# Patient Record
Sex: Male | Born: 1990 | Race: White | Hispanic: No | Marital: Single | Smoking: Current every day smoker
Health system: Southern US, Community
[De-identification: ages and names within clinical notes are randomized; demographics above are authoritative.]

## PROBLEM LIST (undated history)

## (undated) DIAGNOSIS — F329 Major depressive disorder, single episode, unspecified: Secondary | ICD-10-CM

## (undated) DIAGNOSIS — G709 Myoneural disorder, unspecified: Secondary | ICD-10-CM

## (undated) DIAGNOSIS — I10 Essential (primary) hypertension: Secondary | ICD-10-CM

## (undated) DIAGNOSIS — K219 Gastro-esophageal reflux disease without esophagitis: Secondary | ICD-10-CM

## (undated) HISTORY — PX: OTHER SURGICAL HISTORY: SHX169

## (undated) HISTORY — PX: BLADDER SURGERY: SHX569

## (undated) HISTORY — DX: Essential (primary) hypertension: I10

## (undated) HISTORY — PX: APPENDECTOMY: SHX54

## (undated) HISTORY — DX: Myoneural disorder, unspecified: G70.9

---
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
## 2009-09-10 ENCOUNTER — Ambulatory Visit: Payer: Self-pay | Admitting: Family Medicine

## 2009-09-10 ENCOUNTER — Encounter: Payer: Self-pay | Admitting: Family Medicine

## 2009-09-10 DIAGNOSIS — I10 Essential (primary) hypertension: Secondary | ICD-10-CM | POA: Insufficient documentation

## 2009-09-10 LAB — CONVERTED CEMR LAB
Ketones, urine, test strip: NEGATIVE
Nitrite: POSITIVE
Protein, U semiquant: 30
pH: 5.5

## 2009-10-06 ENCOUNTER — Ambulatory Visit: Payer: Self-pay | Admitting: Family Medicine

## 2009-10-28 ENCOUNTER — Encounter
Admission: RE | Admit: 2009-10-28 | Discharge: 2009-11-04 | Payer: Self-pay | Admitting: Physical Medicine & Rehabilitation

## 2009-11-04 ENCOUNTER — Ambulatory Visit: Payer: Self-pay | Admitting: Physical Medicine & Rehabilitation

## 2009-12-02 ENCOUNTER — Encounter: Payer: Self-pay | Admitting: Family Medicine

## 2010-02-09 ENCOUNTER — Encounter: Payer: Self-pay | Admitting: Family Medicine

## 2010-04-01 ENCOUNTER — Encounter: Payer: Self-pay | Admitting: Family Medicine

## 2010-04-06 ENCOUNTER — Encounter: Payer: Self-pay | Admitting: Family Medicine

## 2010-04-06 LAB — CONVERTED CEMR LAB
BUN: 12 mg/dL
CO2: 26 meq/L
Chloride: 105 meq/L
HDL: 44 mg/dL
LDL Cholesterol: 70.3 mg/dL
Potassium: 3.8 meq/L
Sodium: 145 meq/L
Total CHOL/HDL Ratio: 3
Triglycerides: 83 mg/dL
VLDL: 16.6 mg/dL

## 2010-04-15 ENCOUNTER — Encounter: Payer: Self-pay | Admitting: Family Medicine

## 2010-06-02 NOTE — Miscellaneous (Signed)
Summary: home health forms done  Clinical Lists Changes [facility] forms completed and put in to be mailed box.  Sarah Swaziland MD  February 09, 2010 9:33 AM

## 2010-06-02 NOTE — Miscellaneous (Signed)
Summary: Home health certification and orders for labs  Clinical Lists Changes Filled out home health forms.  Noted BP is 128/80.   Requested TSH, BMP and fasting lipid panel.  Bjorn Loser took the verbal order and said they will send written confirmation.  Sarah Swaziland MD  April 01, 2010 11:15 AM
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
## 2010-06-02 NOTE — Assessment & Plan Note (Signed)
Summary: f/u eo   Vital Signs:  Patient profile:   20 year old male Weight:      156.5 pounds Temp:     98.4 degrees F oral Pulse rate:   91 / minute Pulse rhythm:   regular BP sitting:   134 / 83  (left arm) Cuff size:   regular  Vitals Entered By: Loralee Pacas CMA (October 06, 2009 2:39 PM)  Primary Care Provider:  Lequita Asal  MD  CC:  f/u BP and UTI.  History of Present Illness: HTN- BP improved on metoprolol. no chest pain,  SOB, headaches, blurred vision, swelling.   UTI- patient took all but two of antibiotic pills. denies dysuria, malodorous urine, incomplete voiding. no fever/chills/N/V, back pain.  Habits & Providers  Alcohol-Tobacco-Diet     Tobacco Status: current     Tobacco Counseling: to quit use of tobacco products  Current Medications (verified): 1)  Foley Catheters 2)  Metoprolol Tartrate 25 Mg Tabs (Metoprolol Tartrate) .... One Tab By Mouth Bid  Allergies (verified): No Known Drug Allergies  Physical Exam  General:  Well-developed,well-nourished,in no acute distress; alert,appropriate and cooperative throughout examination. vitals reviewed.    Impression & Recommendations:  Problem # 1:  CYSTITIS (ICD-595.9) Assessment Improved  resolved. office notes and culture results to urologist.   Orders: [facility]- Est Level  3 (81191)  Problem # 2:  HYPERTENSION, BENIGN ESSENTIAL (ICD-401.1) Assessment: Improved  no changes.   His updated medication list for this problem includes:    Metoprolol Tartrate 25 Mg Tabs (Metoprolol tartrate) ..... One tab by mouth bid  Orders: [facility]- Est Level  3 (47829)   Prevention & Chronic Care Immunizations   Influenza vaccine: Not documented    Tetanus booster: Not documented    Pneumococcal vaccine: Not documented  Other Screening   Smoking status: current  (10/06/2009)   Smoking cessation counseling: yes  (11/21/2007)  Hypertension   Last Blood Pressure: 134 / 83  (10/06/2009)   Serum creatinine:  Not documented   Serum potassium Not documented    Hypertension flowsheet reviewed?: Yes   Progress toward BP goal: At goal  Self-Management Support :   Personal Goals (by the next clinic visit) :      Personal blood pressure goal: 140/90  (09/10/2009)   Hypertension self-management support: Written self-care plan  (09/10/2009)    Hypertension self-management support not done because: Good outcomes  (10/06/2009)

## 2010-06-02 NOTE — Miscellaneous (Signed)
Summary: HH forms  Clinical Lists Changes Home health forms signed and placed in to be mailed box. Sarah Swaziland MD  December 02, 2009 9:18 AM

## 2010-06-04 NOTE — Miscellaneous (Signed)
Summary: lab report (BMP,TSH, lipid panel-nl) entered  Clinical Lists Changes  Observations: Added new observation of DM PROGRESS: N/A (04/15/2010 9:28) Added new observation of DM FSREVIEW: N/A (04/15/2010 9:28) Added new observation of LIPID PROGRS: N/A (04/15/2010 9:28) Added new observation of LIPID FSREVW: N/A (04/15/2010 9:28) Added new observation of TSH: 2.69 microintl units/mL (04/06/2010 9:28) Added new observation of CHOL/HDL: 3.0  (04/06/2010 9:28) Added new observation of VLDL: 16.6 mg/dL (16/02/9603 5:40) Added new observation of LDL: 70.3 mg/dL (98/03/9146 8:29) Added new observation of HDL: 44 mg/dL (56/21/3086 5:78) Added new observation of TRIGLYC TOT: 83 mg/dL (46/96/2952 8:41) Added new observation of CHOLESTEROL: 131 mg/dL (32/44/0102 7:25) Added new observation of GFR: >60 mL/min (04/06/2010 9:28) Added new observation of CREATININE: 0.97 mg/dL (36/64/4034 7:42) Added new observation of BUN: 12 mg/dL (59/56/3875 6:43) Added new observation of BG RANDOM: 72 mg/dL (32/95/1884 1:66) Added new observation of ANION GAP: 9.5  (04/06/2010 9:28) Added new observation of CO2 PLSM/SER: 26 meq/L (04/06/2010 9:28) Added new observation of CL SERUM: 105 meq/L (04/06/2010 9:28) Added new observation of K SERUM: 3.8 meq/L (04/06/2010 9:28) Added new observation of NA: 145 meq/L (04/06/2010 9:28)      Prevention & Chronic Care Immunizations   Influenza vaccine: Not documented    Tetanus booster: Not documented    Pneumococcal vaccine: Not documented  Other Screening   Smoking status: current  (10/06/2009)   Smoking cessation counseling: yes  (11/21/2007)  Hypertension   Last Blood Pressure: 134 / 83  (10/06/2009)   Serum creatinine: 0.97  (04/06/2010)   Serum potassium 3.8  (04/06/2010)  Self-Management Support :   Personal Goals (by the next clinic visit) :      Personal blood pressure goal: 140/90  (09/10/2009)   Hypertension self-management support:  Written self-care plan  (09/10/2009)    Hypertension self-management support not done because: Good outcomes  (10/06/2009)

## 2010-06-10 ENCOUNTER — Encounter: Payer: Self-pay | Admitting: *Deleted

## 2010-07-03 ENCOUNTER — Encounter: Payer: Self-pay | Admitting: Family Medicine

## 2010-07-03 ENCOUNTER — Ambulatory Visit (INDEPENDENT_AMBULATORY_CARE_PROVIDER_SITE_OTHER): Payer: Medicaid Other | Admitting: Family Medicine

## 2010-07-03 VITALS — BP 114/70 | HR 123 | Temp 101.5°F | Wt 147.0 lb

## 2010-07-03 DIAGNOSIS — J029 Acute pharyngitis, unspecified: Secondary | ICD-10-CM

## 2010-07-03 DIAGNOSIS — J020 Streptococcal pharyngitis: Secondary | ICD-10-CM

## 2010-07-03 MED ORDER — AMOXICILLIN 875 MG PO TABS: 875.0000 mg | ORAL_TABLET | Freq: Two times a day (BID) | ORAL | Status: AC

## 2010-07-03 NOTE — Progress Notes (Signed)
Subjective:     James Richard is a 20 y.o. male who presents for evaluation of sore throat. Associated symptoms include suspected fevers but not measured at home and sore throat. Onset of symptoms was 1 day ago, and have been gradually worsening since that time. He is not drinking much. He has not had a recent close exposure to someone with proven streptococcal pharyngitis. He also states he has tried tylenol at home for the fever.  He has also noticed dysphagia without cough, nausea, vomiting, diarrhea.  He denies any recent travel and new medications. He also denies any sexual activity.   The following portions of the patient's history were reviewed and updated as appropriate: allergies, current medications, past medical history, past social history, past surgical history and problem list.  Review of Systems Pertinent items are noted in HPI.    Objective:    BP 114/70  Pulse 123  Temp(Src) 101.5 F (38.6 C) (Oral)  Wt 147 lb (66.679 kg) General appearance: alert and cooperative Head: Normocephalic, without obvious abnormality, atraumatic Eyes: conjunctivae/corneas clear. PERRL, EOM's intact. Fundi benign. Ears: normal TM's and external ear canals both ears Nose: Nares normal. Septum midline. Mucosa normal. No drainage or sinus tenderness. Throat: lips, mucosa, and tongue normal; teeth and gums normal and tonsillar hypertrophy 2+ with pharyngeal erythema, mild anterior lymphadenopathy soft nontender mobile Lungs: clear to auscultation bilaterally Heart: S1, S2 normal and tachycardia with rate 110s, regular  Laboratory Strep test done. Results:positive.    Assessment:    Acute pharyngitis, likely  Strep throat.    Plan:    Patient placed on antibiotics. Follow up in 1 week.

## 2010-07-03 NOTE — Patient Instructions (Signed)
It was a pleasure to care for you today.  I have started you on an antibiotic for strep throat infection.  Please return in one week for reevaluation.  Return immediately if with worsening fever, chills, inability to tolerate the medication by mouth or any other concerning symptom.

## 2010-07-31 ENCOUNTER — Ambulatory Visit (HOSPITAL_BASED_OUTPATIENT_CLINIC_OR_DEPARTMENT_OTHER): Payer: Medicaid Other | Admitting: Physical Medicine & Rehabilitation

## 2010-07-31 ENCOUNTER — Encounter: Payer: Medicaid Other | Attending: Physical Medicine & Rehabilitation

## 2010-07-31 DIAGNOSIS — S34109A Unspecified injury to unspecified level of lumbar spinal cord, initial encounter: Secondary | ICD-10-CM

## 2010-07-31 DIAGNOSIS — N319 Neuromuscular dysfunction of bladder, unspecified: Secondary | ICD-10-CM

## 2010-07-31 DIAGNOSIS — G9589 Other specified diseases of spinal cord: Secondary | ICD-10-CM | POA: Insufficient documentation

## 2010-07-31 DIAGNOSIS — X58XXXS Exposure to other specified factors, sequela: Secondary | ICD-10-CM | POA: Insufficient documentation

## 2010-07-31 DIAGNOSIS — G8220 Paraplegia, unspecified: Secondary | ICD-10-CM | POA: Insufficient documentation

## 2010-07-31 DIAGNOSIS — IMO0002 Reserved for concepts with insufficient information to code with codable children: Secondary | ICD-10-CM | POA: Insufficient documentation

## 2010-07-31 DIAGNOSIS — K592 Neurogenic bowel, not elsewhere classified: Secondary | ICD-10-CM | POA: Insufficient documentation

## 2010-09-15 NOTE — Assessment & Plan Note (Signed)
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  He had used  Lofstrand crutches to ambulate and bilateral AFOs.  He has had  development of left upper foot lesion per his mother's report, since I  last saw him on May 25, 2007.  He did follow up with Dr. Isabel Caprice and  had a renal ultrasound.   He has had no other medical issues in the interval time.   Pain level is 0.  He can walk 15 minutes at a time with his Lofstrand  crutches.  He can climb steps with his crutches.  He does not drive.   PHYSICAL EXAMINATION:  VITAL SIGNS:  His blood pressure is 141/63, pulse  84, respiration 18, and O2 sat 97% on room air.  GENERAL:  In no acute distress.  Alert and oriented x3,  Affect is  bright.  He ambulates with crutches.  NEUROLOGIC:  Coordination in upper extremity is normal.  His sensation  in the upper extremity is normal.  His strength in the upper extremity  is normal.  Range of motion is normal in the upper extremities.  Lower  extremity has a callus that has some breakdown near the center in the  left anterolateral foot, just below the joint line, just anterior to the  lateral malleolus.  His pulses are good.  He does have decreased  sensation in the feet, although he is able to distinguish with light  touch.  He has clonus at the left ankle.  Right ankle has 0 reflex.   IMPRESSION:  1. 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  2. Left ankle-foot orthosis, appears to be causing some rubbing at the      lateral malleolar trim line.  Will ask [employer] to reevaluate.  If      correction does not result in healing of wound, we will need to      send him to [facility].  No evidence of infection at this point.   I will see him back in 1 month for followup.      Erick Colace, M.D.  Electronically Signed     AEK/MedQ  D:   11/13/2007 16:42:21  T:  11/14/2007 09:00:15  Job #:  [id number]
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
## 2010-09-15 NOTE — Op Note (Signed)
NAME:  James Richard, James Richard              ACCOUNT NO.:  [id number]   MEDICAL RECORD NO.:  [id number]          PATIENT TYPE:  INP   LOCATION:  2550                         FACILITY:  [facility]   PHYSICIAN:  Adolph Pollack, M.D.DATE OF BIRTH:  1990-06-05   DATE OF PROCEDURE:  09/07/2006  DATE OF DISCHARGE:                               OPERATIVE REPORT   PREOP DIAGNOSIS:  Acute appendicitis.   POSTOPERATIVE DIAGNOSIS:  Acute suppurative appendicitis.   PROCEDURE:  Laparoscopic appendectomy.   SURGEON:  Adolph Pollack, M.D.   ANESTHESIA:  General.   INDICATIONS:  This 20 year old male presented to the emergency  department with worsening abdominal pain and fever.  The pain radiated  to the right lower quadrant and he had leukocytosis.  A CT scan was  consistent with acute appendicitis with possible rupture; and he is now  brought to the operating room.   TECHNIQUE:  He is brought to the operating room and placed supine on the  operating table where general anesthetic was administered.  A Foley  catheter was placed in the bladder.  The abdominal wall was sterilely  prepped and draped.  Marcaine solution was infiltrated in the  supraumbilical region.  He had a spinal injury and had some scoliosis,  so he had a little bit of a shortened abdominal wall with respect to the  craniocaudal aspect.   I made a supraumbilical incision through the skin, subcutaneous tissue,  fascia, and peritoneum; entering the peritoneal cavity under direct  vision.  A pursestring suture of #0 Vicryl was placed around the fascial  edges.  A Hassan trocar was introduced into the peritoneal cavity and a  pneumoperitoneum was created by insufflation of CO2 gas.   Next a laparoscope was introduced.  I noticed that some of the fluid was  slightly cloudy yellow, but not frank pus.  A 5-mm trocar was placed in  the left lower quadrant, and one in the right upper quadrant.  I was  able to expose the appendix; and  it was densely adherent to the pelvic  sidewall.  Using careful hydrodissection and blunt dissection, I freed  it from this attachment.  The appendix was acutely inflamed with some  gangrenous changes and suppuration, but no perforation.  I retracted the  appendix anteriorly and divided the mesoappendix down toward the base of  the cecum with the harmonic scalpel.  I subsequently was then able to  amputate the appendix off the cecum using the Endo-GIA stapler.  I  placed the appendix in the Endopouch bag and removed it through the  supraumbilical port; and then replaced it with a supraumbilical trocar.   I inspected the staple line at the cecal area; and it was hemostatic  with no evidence of leak.  I subsequently copiously irrigated out the  abdominal cavity with 3 liters of saline solution and evacuated this.  The fluid was clear.  I, once again, inspected the staple line; and  there was no bleeding or leak noted.   I subsequently removed the left lower quadrant trocar and the  supraumbilical trocar; and I closed  the supraumbilical fascial defect  under laparoscopic vision by tightening up and tying down the  pursestring suture.  The remaining trocar was removed and the  pneumoperitoneum was released.   The skin incisions were closed with 4-0 Monocryl subcuticular stitches  followed by Steri-Strips and sterile dressings.  He tolerated the  procedure well without apparent complications; and was taken to the  recovery room in satisfactory condition.      Adolph Pollack, M.D.  Electronically Signed     TJR/MEDQ  D:  09/07/2006  T:  09/08/2006  Job:  [id number]

## 2010-09-15 NOTE — Assessment & Plan Note (Signed)
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  He has used Lofstrand crutches to  ambulate with bilateral AFOs.  He had a left dorsum of the foot lesion.  When I last saw him, he had his AFO checked by orthopedist who felt it  was not due to abnormal fit.   He is followed up with Dr. Isabel Caprice from Urology.  He remains on  intermittent catheterization program.  He has zero pain.  He has not  gone to high school.  He really did not finish.  He thought he was going  to move and did not enroll.  He needs assist with meal prep, household  duties, and shopping.  He has trouble walking and spasms.   His feet have intact sensation.  He has no active movement in the ankle  dorsiflexors or plantar flexors bilaterally.  He has 4- knee extensor on  the right, 4+ on the left.  His feet have no evidence of breakdown.  He  has good pulses bilaterally.  He has a callus on the dorsum of his foot  on the left side only.  He has clonus bilateral ankles.   Blood pressure 157/79, pulse 100, respirations 18, O2 sat 98% on room  air.  He just did ambulate in with Lofstrand crutches.   His last BP was 141/63.   IMPRESSION:  1. Conus medullaris syndrome with incomplete paraplegia, has good      upper extremity strength.  We went over weightlifting; however, she      limit overhead weightlifting given that he will be prone to overuse      injury of the shoulders due to Lofstrand  crutch ambulation.  2. Encouraged him to build his quads.  I think, he can benefit from      some strengthening using an ankle weight with knee extensions.  3. I will see him back in 6 months.  We will discuss further in terms      of return to school that is in his plans or doing some kind of      vocational with trade school.   He will follow up with Urology in the meantime.      Erick Colace, M.D.  Electronically Signed     AEK/MedQ  D:  05/16/2008 14:07:47   T:  05/17/2008 03:49:01  Job #:  [id number]   cc:   Valetta Fuller, M.D.  Fax: [phone]
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
## 2010-09-15 NOTE — Assessment & Plan Note (Signed)
An 20 year old male with paraplegia due to a conus medullaris syndrome  dating back to his spinal cord injury incomplete age 22.  He ambulates  with loss trend crutches bilateral AFOs.  He continues to follow up with  Dr. Isabel Caprice from Urology intermittent catheterization program.  He is on  Detrol for bladder spasms.   He needs assist with meal prep, household duties, but otherwise  independent.  He walks with loss trend crutches 20 minutes at a time.  He has no pain.  His sleep is good.  Remainder review of systems is  negative.   Social, single, lives with his grandmother.  No drug or alcohol abuse.   Blood pressure 147/89, pulse 108, respirations 18, O2 sat 96% on room  air.  Well-developed, well-nourished male with short stature and  orientation x3.  Affect alert.  Gait is with loss trend crutches.  He  tends to drag his feet, it has been valgus at the knees.   He has 0 ankle dorsiflexor flaccid in the ankle joint area.  He has 4-  extensor strength right, quad 4+ left.  Hip flexors are 4+ bilateral.  He has a callus over the right navicular as well as the left cuboid.   IMPRESSION:  1. Conus medullaris syndrome incomplete paraplegia.  His AFOs are      fitting him well with some callus, but no other skin breakdown.  We      will go ahead and encourage him to build up his quad strength.  2. Followup with Urology, Dr. Isabel Caprice.  3. I will see him back in the year, sooner if he needs.      Erick Colace, M.D.  Electronically Signed     AEK/MedQ  D:  11/07/2008 15:10:44  T:  11/08/2008 02:09:42  Job #:  [id number]   cc:   Valetta Fuller, M.D.  Fax: [phone]

## 2010-10-15 ENCOUNTER — Ambulatory Visit: Payer: Medicaid Other | Admitting: Family Medicine

## 2011-05-17 ENCOUNTER — Ambulatory Visit (INDEPENDENT_AMBULATORY_CARE_PROVIDER_SITE_OTHER): Payer: Medicaid Other | Admitting: Family Medicine

## 2011-05-17 ENCOUNTER — Encounter: Payer: Self-pay | Admitting: Family Medicine

## 2011-05-17 VITALS — BP 136/78 | HR 96 | Ht 66.0 in | Wt 140.0 lb

## 2011-05-17 DIAGNOSIS — R030 Elevated blood-pressure reading, without diagnosis of hypertension: Secondary | ICD-10-CM

## 2011-05-17 DIAGNOSIS — Z23 Encounter for immunization: Secondary | ICD-10-CM

## 2011-05-17 DIAGNOSIS — Z299 Encounter for prophylactic measures, unspecified: Secondary | ICD-10-CM

## 2011-05-17 DIAGNOSIS — N319 Neuromuscular dysfunction of bladder, unspecified: Secondary | ICD-10-CM

## 2011-05-17 DIAGNOSIS — Z0000 Encounter for general adult medical examination without abnormal findings: Secondary | ICD-10-CM

## 2011-05-17 DIAGNOSIS — F17200 Nicotine dependence, unspecified, uncomplicated: Secondary | ICD-10-CM

## 2011-05-17 NOTE — Patient Instructions (Signed)
That's great that you are considering quitting smoking again.  If you want to meet with Dr. Raymondo Band, we will be happy to set that up.  You might also want to think aobut the Quitline 1 [phone] QUIT NOW.  Your blood pressure is fine today. Good luck on the test tonight. We will give you the flu shot and tetanus/whooping cough shot today. Please call and make an appointment with the lab for a cholesterol test when it suits you.

## 2011-05-18 ENCOUNTER — Encounter: Payer: Self-pay | Admitting: Family Medicine

## 2011-05-18 DIAGNOSIS — Z299 Encounter for prophylactic measures, unspecified: Secondary | ICD-10-CM | POA: Insufficient documentation

## 2011-05-18 DIAGNOSIS — R030 Elevated blood-pressure reading, without diagnosis of hypertension: Secondary | ICD-10-CM | POA: Insufficient documentation

## 2011-05-18 DIAGNOSIS — Z23 Encounter for immunization: Secondary | ICD-10-CM

## 2011-05-18 NOTE — Assessment & Plan Note (Signed)
Doing well without problems.  Has adequate supplies.  Continue current management.

## 2011-05-18 NOTE — Assessment & Plan Note (Addendum)
Due for lipid screening.  RF include tobacco use.   No high risk behaviors noted.  + Exercise.  No high risk sexual activity.  Uses seat belt. Flu and Tdap immunizations today.

## 2011-05-18 NOTE — Assessment & Plan Note (Addendum)
Counseled about cessation and offered resources.  Pt would like to quit on his own.
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
## 2011-07-27 ENCOUNTER — Ambulatory Visit: Payer: Medicaid Other | Admitting: Physical Medicine & Rehabilitation

## 2011-08-16 ENCOUNTER — Ambulatory Visit: Payer: Medicaid Other | Admitting: Physical Medicine & Rehabilitation

## 2011-08-27 ENCOUNTER — Encounter: Payer: Medicaid Other | Attending: Physical Medicine & Rehabilitation

## 2011-08-27 ENCOUNTER — Encounter: Payer: Self-pay | Admitting: Physical Medicine & Rehabilitation

## 2011-08-27 ENCOUNTER — Ambulatory Visit (HOSPITAL_BASED_OUTPATIENT_CLINIC_OR_DEPARTMENT_OTHER): Payer: Medicaid Other | Admitting: Physical Medicine & Rehabilitation

## 2011-08-27 VITALS — BP 139/74 | HR 93 | Ht 67.0 in | Wt 160.0 lb

## 2011-08-27 DIAGNOSIS — N319 Neuromuscular dysfunction of bladder, unspecified: Secondary | ICD-10-CM | POA: Insufficient documentation

## 2011-08-27 DIAGNOSIS — Q059 Spina bifida, unspecified: Secondary | ICD-10-CM | POA: Insufficient documentation

## 2011-08-27 DIAGNOSIS — Q057 Lumbar spina bifida without hydrocephalus: Secondary | ICD-10-CM

## 2011-08-27 DIAGNOSIS — G8220 Paraplegia, unspecified: Secondary | ICD-10-CM | POA: Insufficient documentation

## 2011-08-27 NOTE — Patient Instructions (Addendum)
Myelomeningocele Myelomeningocele happens when a fetus is in the womb. It is a type of spina bifida. It is a problem where the spinal column and vertebrae are not formed right in the womb. When the spinal cord is forming, it normally starts out shaped like an open tube. This tube slowly closes from top to bottom. When this tube does not close all the way, problems like myelomeningocele happen. It causes a sac containing spinal cord nerves, the covering of the spinal cord, and spinal fluid to stick out of the back at birth.  CAUSES  The exact cause of myelomeningocele is not known. However, having one child with this problem increases the risk in future pregnancies.  SYMPTOMS  A fluid-filled sac sticks out of the back at birth. Other symptoms depend on where in the back the problem exists. Symptoms can include:  Loss of bladder or bowel control. This can cause urine or bowel leaking. Sometimes there can be constipation or inability to pass stool.   Lack of feeling in body parts below the problem.   Partially or completely unable to move the legs (paralysis). This can cause difficulties or the inability to crawl or walk.   Joint problems. This is mostly noticed in the hips, knees, and feet. Joint issues can cause deformities or problems with normal function of the joints.   Fluid and pressure buildup in the skull due to poor drainage of the spinal fluid.  Most children with this problem have normal intelligence. They can live a normal life span with the right care.  DIAGNOSIS  Myelomeningocele can be diagnosed before birth. Screening blood tests and an ultrasound of the fetus are usually performed to confirm a diagnosis.  TREATMENT  There is no cure for myelomeningocele. There are many treatments available for associated problems. Usually, a team of caregivers are needed to address all the issues.  Surgery is often performed first. Surgery will close the opening in the back to stop infection.  Often, another surgery is done to place a tube called a VP shunt (ventriculoperitoneal shunt) from the brain to the abdomen. This tube prevents the buildup of fluid and pressure in the skull (hydrocephalus) due to the lack of normal drainage of the spinal fluid.   Corrective surgery and bracing is often needed for leg problems. A curved spine may need treatment. The muscles, joints, bones, urinary tract system, nervous system, and intestines may be evaluated to determine the specific treatment. Orthopedic (muscles, joint, and bones) problems with the knees, feet, hips, or back may need treatment right away and careful following over time.   Medications, surgery, and putting a tube in the bladder to help empty urine (bladder catheterization) can help with bladder problems.Kidney and bladder function need regular testing.   Medicines, diet, and putting fluid into the rectum (enema) can help with constipation and stool leakage.   Medications are used to treat muscle jerking or shaking (convulsions).  HOME CARE INSTRUCTIONS   Loss of feeling in the buttocks and legs can lead to injury. To prevent this, careful attention to bath water temperature and other heat sources is important. Careful skin care and watching for redness or sores should be done.   The parents are an important part of the team of caregivers. Treatments for bladder, orthopedic, and bowel problems will need to continue at home. These will be directed by the caregiver team.   Families are often stressed by their child being diagnosed with this condition and all the ongoing treatments  and surgeries. Support groups and counseling are useful in helping families work through the issues. Look to friends and families for support if needed.  SEEK MEDICAL CARE IF:   Your child shows signs of increased fluid and pressure buildup due to blockage of the VP shunt.   Headaches.   Personality changes.   Poor school performance.   Throwing up  (vomiting).   Your child shows signs of problems around the spinal cord surgery area.   Change in walking.   Loss of strength.   Unusual stiffness in legs.   Change in bowel or bladder function.   Pain in the surgery area.   Worsening tiredness (fatigue) in the legs.   Increase in any curvature of the spine   You suspect your child is having convulsions, look for:   Rhythmic jerking or twitching of the arms or legs.   Sudden falls for no reason.   Lack of response or dazed behavior for brief periods.   Staring or rapid blinking.   Unusual sleepiness or irritability when waking.  SEEK IMMEDIATE MEDICAL CARE IF:   Your child shows signs of increased fluid and pressure buildup due to blockage of the VP shunt.   Bulging soft spot.   Increasing head size.   Irritability.   Poor feeding.   Feeling sleepy all the time.   Throwing up (vomiting).   Your child shows signs of increased pressure buildup at the base of the brain.   Squeaky, labored inhaling.   Choking with food.   Hoarseness or lack of a voice.   Shallow breathing or periods where breathing stops.   There are signs of infection of the shunt, which would be any of the signs of increased pressure plus a fever.   A convulsion lasts more than 5 minutes, is unusual in some way, or breathing trouble follows.  MAKE SURE YOU:   Understand these instructions.   Will watch your condition.   Will get help right away if you are not doing well or get worse.  Document Released: 05/09/2007 Document Revised: 04/08/2011 Document Reviewed: 05/09/2007 [facility] Patient Information 2012 [place], [place].

## 2011-08-27 NOTE — Progress Notes (Signed)
  Subjective:    Patient ID: James Richard, male    DOB: 01/12/1991, 21 y.o.   MRN: [id number]  HPI  no new medical issues since last visit approximately one year ago. Continues to see a urologist for his chronic neurogenic bladder.  Finishing her GED and plans to go to college. Now has a driver's license and is driving Pain Inventory Average Pain 0 Pain Right Now 0 My pain is n/a  In the last 24 hours, has pain interfered with the following? General activity 0 Relation with others 0 Enjoyment of life 0 What TIME of day is your pain at its worst? n/a Sleep (in general) Good  Pain is worse with: walking Pain improves with: rest Relief from Meds: 0  Mobility walk with assistance how many minutes can you walk? 10 min ability to climb steps?  yes do you drive?  yes  Function not employed: date last employed   Neuro/Psych bladder control problems trouble walking  Prior Studies Any changes since last visit?  no  Physicians involved in your care Any changes since last visit?  no       Review of Systems  Genitourinary: Positive for difficulty urinating.  All other systems reviewed and are negative.       Objective:   Physical Exam  Constitutional: He is oriented to person, place, and time. He appears well-developed and well-nourished.  Musculoskeletal:       Right shoulder: Normal.       Left shoulder: Normal.  Neurological: He is alert and oriented to person, place, and time. No sensory deficit.        Bilateral foot drop using AFOs   hip flexor knee extensor strength is 4/5  Psychiatric: He has a normal mood and affect.          Assessment & Plan:   1. 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 independent

## 2011-08-29 DIAGNOSIS — Q057 Lumbar spina bifida without hydrocephalus: Secondary | ICD-10-CM | POA: Insufficient documentation

## 2011-11-08 ENCOUNTER — Telehealth: Payer: Self-pay | Admitting: Physical Medicine & Rehabilitation

## 2011-11-08 NOTE — Telephone Encounter (Signed)
Cuff of crutch is broken.  How does she go about getting new crutches?

## 2011-11-09 ENCOUNTER — Telehealth: Payer: Self-pay | Admitting: Family Medicine

## 2011-11-09 NOTE — Telephone Encounter (Signed)
LMOM informing of below.  

## 2011-11-09 NOTE — Telephone Encounter (Signed)
Will hand write a script for the braces and crutches.  Unable to figure out how to order through [employer].

## 2011-11-09 NOTE — Telephone Encounter (Signed)
Lm advising pt to contact PT, [facility] or [facility].  Numbers given.

## 2011-11-09 NOTE — Telephone Encounter (Signed)
Grandmother is calling because since he has Medicaid, she doesn't know where she can go to get the Rx for Arm Crutches filled and would like Dr. [place] to assist with this.

## 2011-11-09 NOTE — Telephone Encounter (Signed)
Rx left at front desk.  Could you let family know?

## 2011-11-09 NOTE — Telephone Encounter (Signed)
His braces for his arm crutches has broken and needs a new script to replace these.  pls call when ready - needs these asap since he can't get around without them.

## 2011-11-16 NOTE — Telephone Encounter (Signed)
It looks like the [facility] gave him some phone numbers of people to call.  I would try those, but if he still needs help we can try to figure something out.

## 2011-11-16 NOTE — Telephone Encounter (Signed)
I know he uses one when it is raining and needs to go out an about, but I don't see why he would need a new one.  He had a wheelchair at his last visit with me, so I am unclear why someone would make him get a new one.

## 2011-11-16 NOTE — Telephone Encounter (Signed)
Spoke with grandmother and she stated that when she went to go get crutches that she was told James Richard needed a wheelchair also. He has not used a wheelchair in years she said. Will patient need to come in first before this can be done? i do not believe you have seen him before

## 2012-08-07 ENCOUNTER — Encounter: Payer: Self-pay | Admitting: Family Medicine

## 2012-08-07 ENCOUNTER — Ambulatory Visit (INDEPENDENT_AMBULATORY_CARE_PROVIDER_SITE_OTHER): Payer: Medicaid Other | Admitting: Family Medicine

## 2012-08-07 VITALS — BP 127/76 | HR 95 | Temp 99.3°F | Wt 164.0 lb

## 2012-08-07 DIAGNOSIS — Z299 Encounter for prophylactic measures, unspecified: Secondary | ICD-10-CM

## 2012-08-07 DIAGNOSIS — Z0000 Encounter for general adult medical examination without abnormal findings: Secondary | ICD-10-CM

## 2012-08-07 DIAGNOSIS — R030 Elevated blood-pressure reading, without diagnosis of hypertension: Secondary | ICD-10-CM

## 2012-08-07 DIAGNOSIS — F17200 Nicotine dependence, unspecified, uncomplicated: Secondary | ICD-10-CM

## 2012-08-07 DIAGNOSIS — N319 Neuromuscular dysfunction of bladder, unspecified: Secondary | ICD-10-CM

## 2012-08-07 LAB — COMPREHENSIVE METABOLIC PANEL
ALT: 19 U/L (ref 0–53)
AST: 25 U/L (ref 0–37)
BUN: 14 mg/dL (ref 6–23)
Creat: 1.19 mg/dL (ref 0.50–1.35)
Total Bilirubin: 0.4 mg/dL (ref 0.3–1.2)

## 2012-08-07 LAB — CBC
HCT: 43.4 % (ref 39.0–52.0)
MCH: 29 pg (ref 26.0–34.0)
MCV: 87.5 fL (ref 78.0–100.0)
Platelets: 201 10*3/uL (ref 150–400)
RDW: 13.1 % (ref 11.5–15.5)
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
## 2012-08-07 NOTE — Assessment & Plan Note (Signed)
Not ready to quit. Plan: Counseling and discussed resources.

## 2012-08-07 NOTE — Patient Instructions (Addendum)
It has been a pleasure to see you today. I will call you with the labs results if they come back abnormal. Make your next appointment in 1 year or sooner if needed.

## 2012-08-07 NOTE — Assessment & Plan Note (Signed)
Normal BP today!

## 2012-08-07 NOTE — Assessment & Plan Note (Signed)
Lipid profile not indicated on pt with low risk factors and younger of 22 y/o. Smoking Cessation counseled. Pt not ready to quit yet.

## 2012-08-07 NOTE — Assessment & Plan Note (Signed)
Self cath QID on Vesicare Plan: Continue current regimen.

## 2012-08-21 ENCOUNTER — Encounter: Payer: Self-pay | Admitting: Physical Medicine & Rehabilitation

## 2012-08-21 ENCOUNTER — Encounter: Payer: Medicaid Other | Attending: Physical Medicine & Rehabilitation

## 2012-08-21 ENCOUNTER — Ambulatory Visit (HOSPITAL_BASED_OUTPATIENT_CLINIC_OR_DEPARTMENT_OTHER): Payer: Medicaid Other | Admitting: Physical Medicine & Rehabilitation

## 2012-08-21 VITALS — BP 147/71 | HR 79 | Resp 16 | Ht 66.0 in | Wt 165.0 lb

## 2012-08-21 DIAGNOSIS — G8220 Paraplegia, unspecified: Secondary | ICD-10-CM | POA: Insufficient documentation

## 2012-08-21 DIAGNOSIS — F17200 Nicotine dependence, unspecified, uncomplicated: Secondary | ICD-10-CM | POA: Insufficient documentation

## 2012-08-21 DIAGNOSIS — I10 Essential (primary) hypertension: Secondary | ICD-10-CM | POA: Insufficient documentation

## 2012-08-21 DIAGNOSIS — N319 Neuromuscular dysfunction of bladder, unspecified: Secondary | ICD-10-CM | POA: Insufficient documentation

## 2012-08-21 DIAGNOSIS — Q057 Lumbar spina bifida without hydrocephalus: Secondary | ICD-10-CM

## 2012-08-21 DIAGNOSIS — Q059 Spina bifida, unspecified: Secondary | ICD-10-CM | POA: Insufficient documentation
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
## 2013-03-09 ENCOUNTER — Ambulatory Visit: Payer: Medicaid Other | Admitting: Family Medicine

## 2013-05-09 ENCOUNTER — Telehealth: Payer: Self-pay | Admitting: Family Medicine

## 2013-05-09 NOTE — Telephone Encounter (Signed)
[facility] Nurse Vickie called. James RadonBradley would like a new wheelchair and crutches. Please advise as to how he should get these

## 2013-05-12 NOTE — Telephone Encounter (Signed)
Pt needs office visit to proper assess and evaluate the indication for this request.

## 2013-05-14 NOTE — Telephone Encounter (Signed)
Related message,patients caregiver voiced understanding. James Richard, James Richard

## 2013-05-23 ENCOUNTER — Ambulatory Visit: Payer: Medicaid Other | Admitting: Family Medicine

## 2013-06-18 ENCOUNTER — Ambulatory Visit: Payer: Medicaid Other | Admitting: Family Medicine

## 2013-06-26 ENCOUNTER — Telehealth: Payer: Self-pay | Admitting: Family Medicine

## 2013-06-26 NOTE — Telephone Encounter (Signed)
[name] need a note written for James Richard stating that he is unable to attend jury duty due to his medical condition.  Also need order to obtain new wheelchair and crutches.  Please call [name] back asap.

## 2013-07-02 ENCOUNTER — Encounter: Payer: Self-pay | Admitting: Family Medicine

## 2013-07-02 NOTE — Telephone Encounter (Signed)
Mom called again about jury duty note. James MccallumJury duty is scheduled for 08-16-13 Please let mom know when the note is ready for pickup

## 2013-07-02 NOTE — Telephone Encounter (Signed)
I have printed the letter. Pt can come to pick it up or if whishes we can mail it to him. Please let him know. Thanks.

## 2013-07-02 NOTE — Telephone Encounter (Signed)
Please advise.Thank You .James Richard S  

## 2013-07-02 NOTE — Telephone Encounter (Signed)
Left message to return my call.James Richard S  

## 2013-09-14 ENCOUNTER — Emergency Department (HOSPITAL_COMMUNITY)
Admission: EM | Admit: 2013-09-14 | Discharge: 2013-09-16 | Disposition: A | Discharge status: Home / Self Care | Payer: Medicaid Other | Attending: Emergency Medicine | Admitting: Emergency Medicine

## 2013-09-14 ENCOUNTER — Encounter (HOSPITAL_COMMUNITY): Payer: Self-pay | Admitting: Emergency Medicine

## 2013-09-14 DIAGNOSIS — R45851 Suicidal ideations: Secondary | ICD-10-CM

## 2013-09-14 DIAGNOSIS — F1210 Cannabis abuse, uncomplicated: Secondary | ICD-10-CM | POA: Insufficient documentation

## 2013-09-14 DIAGNOSIS — I10 Essential (primary) hypertension: Secondary | ICD-10-CM | POA: Insufficient documentation

## 2013-09-14 DIAGNOSIS — R45850 Homicidal ideations: Secondary | ICD-10-CM | POA: Insufficient documentation

## 2013-09-14 DIAGNOSIS — Z87828 Personal history of other (healed) physical injury and trauma: Secondary | ICD-10-CM | POA: Insufficient documentation

## 2013-09-14 DIAGNOSIS — F17200 Nicotine dependence, unspecified, uncomplicated: Secondary | ICD-10-CM | POA: Insufficient documentation

## 2013-09-14 DIAGNOSIS — Z79899 Other long term (current) drug therapy: Secondary | ICD-10-CM | POA: Insufficient documentation

## 2013-09-14 DIAGNOSIS — F329 Major depressive disorder, single episode, unspecified: Secondary | ICD-10-CM | POA: Diagnosis present

## 2013-09-14 DIAGNOSIS — G8220 Paraplegia, unspecified: Secondary | ICD-10-CM | POA: Insufficient documentation

## 2013-09-14 DIAGNOSIS — S0510XA Contusion of eyeball and orbital tissues, unspecified eye, initial encounter: Secondary | ICD-10-CM | POA: Insufficient documentation

## 2013-09-14 HISTORY — DX: Major depressive disorder, single episode, unspecified: F32.9

## 2013-09-14 LAB — RAPID URINE DRUG SCREEN, HOSP PERFORMED
AMPHETAMINES: NOT DETECTED
BENZODIAZEPINES: NOT DETECTED
Barbiturates: NOT DETECTED
Cocaine: NOT DETECTED
OPIATES: NOT DETECTED
Tetrahydrocannabinol: POSITIVE — AB

## 2013-09-14 LAB — CBC
HCT: 43.3 % (ref 39.0–52.0)
Hemoglobin: 15 g/dL (ref 13.0–17.0)
MCH: 30.1 pg (ref 26.0–34.0)
MCHC: 34.6 g/dL (ref 30.0–36.0)
MCV: 86.9 fL (ref 78.0–100.0)
PLATELETS: 195 10*3/uL (ref 150–400)
RBC: 4.98 MIL/uL (ref 4.22–5.81)
RDW: 12.7 % (ref 11.5–15.5)
WBC: 17 10*3/uL — AB (ref 4.0–10.5)

## 2013-09-14 LAB — COMPREHENSIVE METABOLIC PANEL
ALBUMIN: 4.5 g/dL (ref 3.5–5.2)
ALT: 24 U/L (ref 0–53)
AST: 34 U/L (ref 0–37)
Alkaline Phosphatase: 86 U/L (ref 39–117)
BILIRUBIN TOTAL: 0.5 mg/dL (ref 0.3–1.2)
BUN: 14 mg/dL (ref 6–23)
CALCIUM: 9.5 mg/dL (ref 8.4–10.5)
CHLORIDE: 103 meq/L (ref 96–112)
CO2: 21 mEq/L (ref 19–32)
CREATININE: 1.09 mg/dL (ref 0.50–1.35)
GFR calc Af Amer: >90 mL/min (ref >90)
GFR calc non Af Amer: >90 mL/min (ref >90)
Glucose, Bld: 88 mg/dL (ref 70–99)
Potassium: 4.2 mEq/L (ref 3.7–5.3)
Sodium: 140 mEq/L (ref 137–147)
Total Protein: 7.1 g/dL (ref 6.0–8.3)

## 2013-09-14 LAB — SALICYLATE LEVEL

## 2013-09-14 LAB — ETHANOL: Alcohol, Ethyl (B): <11 mg/dL (ref 0–11)

## 2013-09-14 LAB — ACETAMINOPHEN LEVEL: Acetaminophen (Tylenol), Serum: <15 ug/mL (ref 10–30)

## 2013-09-14 MED ORDER — NICOTINE 21 MG/24HR TD PT24
21.0000 mg | MEDICATED_PATCH | Freq: Every day | TRANSDERMAL | Status: DC
  Administered 2013-09-14 – 2013-09-16 (×2): 21 mg via TRANSDERMAL
  Filled 2013-09-14 (×2): qty 1

## 2013-09-14 MED ORDER — LORAZEPAM 1 MG PO TABS: 1.0000 mg | ORAL_TABLET | Freq: Three times a day (TID) | ORAL | Status: DC | PRN

## 2013-09-14 MED ORDER — ACETAMINOPHEN 325 MG PO TABS
650.0000 mg | ORAL_TABLET | ORAL | Status: DC | PRN
  Administered 2013-09-14: 650 mg via ORAL
  Filled 2013-09-14: qty 2

## 2013-09-14 MED ORDER — DARIFENACIN HYDROBROMIDE ER 7.5 MG PO TB24
7.5000 mg | ORAL_TABLET | Freq: Every day | ORAL | Status: DC
  Administered 2013-09-14 – 2013-09-16 (×3): 7.5 mg via ORAL
  Filled 2013-09-14 (×3): qty 1

## 2013-09-14 MED ORDER — IBUPROFEN 200 MG PO TABS: 600.0000 mg | ORAL_TABLET | Freq: Three times a day (TID) | ORAL | Status: DC | PRN

## 2013-09-14 MED ORDER — ONDANSETRON HCL 4 MG PO TABS: 4.0000 mg | ORAL_TABLET | Freq: Three times a day (TID) | ORAL | Status: DC | PRN
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
## 2013-09-14 NOTE — ED Notes (Signed)
Patient resting quietly, eyes closed, chest observed for rise and fall. NAD noted.

## 2013-09-14 NOTE — ED Notes (Signed)
Pt brought in by [employer] under IVC  Per paperwork pt has been threatening to kill himself last night and several times before specifying he ws going to shoot himself  Pt has been talking to his dad and stepmom when they are not around  Respondant stated that his only ambition in life was to kill someone

## 2013-09-14 NOTE — ED Notes (Addendum)
Pt has 2 bags of belongings.  Pt has been seen and wanded by security.

## 2013-09-14 NOTE — ED Notes (Signed)
Pt request RN to call grandmother to get IVC paper lifted. TTS team contacted and stated they already talk to grandmother and IVC papers still stand.First opinion done on IVC paperwork. Pt aware that he is awaiting placement.

## 2013-09-14 NOTE — ED Notes (Signed)
Pt belongings: 2 bags  Black bag: GED Certificate, misc. books, 1 black kyocera cell phone (turned off) , 1 pair of blue jeans, 1 pair of gray sweat pants, 1 pair of briefs, 15+  7835f urinary catheters, 1 bottle of cologne, 1 cigar, cell phone cord, deodorant  Pt Belongings bag: 2 shirts,  1 pair of briefs, 1 pair of shorts
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
## 2013-09-14 NOTE — ED Notes (Signed)
Patient resting, eyes closed, chest observed for rise and fall 

## 2013-09-14 NOTE — ED Notes (Signed)
Patient resting quietly, chest observed for equal rise and fall. NAD noted.

## 2013-09-14 NOTE — ED Notes (Signed)
Patient personal wheelchair, shoes, and leg braces placed at nurses station.
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
## 2013-09-14 NOTE — ED Notes (Signed)
Patient belongings at listed below [id number]

## 2013-09-15 ENCOUNTER — Encounter (HOSPITAL_COMMUNITY): Payer: Self-pay | Admitting: Psychiatry

## 2013-09-15 DIAGNOSIS — R45850 Homicidal ideations: Secondary | ICD-10-CM

## 2013-09-15 DIAGNOSIS — F329 Major depressive disorder, single episode, unspecified: Secondary | ICD-10-CM

## 2013-09-15 DIAGNOSIS — F3289 Other specified depressive episodes: Secondary | ICD-10-CM

## 2013-09-15 NOTE — ED Notes (Signed)
Psychiatry at bedside.

## 2013-09-15 NOTE — ED Notes (Signed)
Pt sitting up eating breakfast and watching television.
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
## 2013-09-16 NOTE — Progress Notes (Signed)
Marchelle Folksmanda with Social Work [phone]) is following up with placement for the patient.

## 2013-09-16 NOTE — Discharge Instructions (Signed)
Depression, Adult °Depression refers to feeling sad, low, down in the dumps, blue, gloomy, or empty. In general, there are two kinds of depression: °1. Depression that we all experience from time to time because of upsetting life experiences, including the loss of a job or the ending of a relationship (normal sadness or normal grief). This kind of depression is considered normal, is short lived, and resolves within a few days to 2 weeks. (Depression experienced after the loss of a loved one is called bereavement. Bereavement often lasts longer than 2 weeks but normally gets better with time.) °2. Clinical depression, which lasts longer than normal sadness or normal grief or interferes with your ability to function at home, at work, and in school. It also interferes with your personal relationships. It affects almost every aspect of your life. Clinical depression is an illness. °Symptoms of depression also can be caused by conditions other than normal sadness and grief or clinical depression. Examples of these conditions are listed as follows: °· Physical illness Some physical illnesses, including underactive thyroid gland (hypothyroidism), severe anemia, specific types of cancer, diabetes, uncontrolled seizures, heart and lung problems, strokes, and chronic pain are commonly associated with symptoms of depression. °· Side effects of some prescription medicine In some people, certain types of prescription medicine can cause symptoms of depression. °· Substance abuse Abuse of alcohol and illicit drugs can cause symptoms of depression. °SYMPTOMS °Symptoms of normal sadness and normal grief include the following: °· Feeling sad or crying for short periods of time. °· Not caring about anything (apathy). °· Difficulty sleeping or sleeping too much. °· No longer able to enjoy the things you used to enjoy. °· Desire to be by oneself all the time (social isolation). °· Lack of energy or motivation. °· Difficulty  concentrating or remembering. °· Change in appetite or weight. °· Restlessness or agitation. °Symptoms of clinical depression include the same symptoms of normal sadness or normal grief and also the following symptoms: °· Feeling sad or crying all the time. °· Feelings of guilt or worthlessness. °· Feelings of hopelessness or helplessness. °· Thoughts of suicide or the desire to harm yourself (suicidal ideation). °· Loss of touch with reality (psychotic symptoms). Seeing or hearing things that are not real (hallucinations) or having false beliefs about your life or the people around you (delusions and paranoia). °DIAGNOSIS  °The diagnosis of clinical depression usually is based on the severity and duration of the symptoms. Your caregiver also will ask you questions about your medical history and substance use to find out if physical illness, use of prescription medicine, or substance abuse is causing your depression. Your caregiver also may order blood tests. °TREATMENT  °Typically, normal sadness and normal grief do not require treatment. However, sometimes antidepressant medicine is prescribed for bereavement to ease the depressive symptoms until they resolve. °The treatment for clinical depression depends on the severity of your symptoms but typically includes antidepressant medicine, counseling with a mental health professional, or a combination of both. Your caregiver will help to determine what treatment is best for you. °Depression caused by physical illness usually goes away with appropriate medical treatment of the illness. If prescription medicine is causing depression, talk with your caregiver about stopping the medicine, decreasing the dose, or substituting another medicine. °Depression caused by abuse of alcohol or illicit drugs abuse goes away with abstinence from these substances. Some adults need professional help in order to stop drinking or using drugs. °SEEK IMMEDIATE CARE IF: °· You have   thoughts  about hurting yourself or others. °· You lose touch with reality (have psychotic symptoms). °· You are taking medicine for depression and have a serious side effect. °FOR MORE INFORMATION °National Alliance on Mental Illness: www.nami.org  °National Institute of Mental Health: www.nimh.nih.gov  °Document Released: 04/16/2000 Document Revised: 10/19/2011 Document Reviewed: 07/19/2011 °ExitCare® Patient Information ©2014 [employer]. ° ° ° °Emergency Department Resource Guide °1) Find a Doctor and Pay Out of Pocket °Although you won't have to find out who is covered by your insurance plan, it is a good idea to ask around and get recommendations. You will then need to call the office and see if the doctor you have chosen will accept you as a new patient and what types of options they offer for patients who are self-pay. Some doctors offer discounts or will set up payment plans for their patients who do not have insurance, but you will need to ask so you aren't surprised when you get to your appointment. ° °2) Contact Your Local Health Department °Not all health departments have doctors that can see patients for sick visits, but many do, so it is worth a call to see if yours does. If you don't know where your local health department is, you can check in your phone book. The CDC also has a tool to help you locate your state's health department, and many state websites also have listings of all of their local health departments. ° °3) Find a Walk-in Clinic °If your illness is not likely to be very severe or complicated, you may want to try a walk in clinic. These are popping up all over the country in pharmacies, drugstores, and shopping centers. They're usually staffed by nurse practitioners or physician assistants that have been trained to treat common illnesses and complaints. They're usually fairly quick and inexpensive. However, if you have serious medical issues or chronic medical problems, these are probably not  your best option. ° °No Primary Care Doctor: °- Call [facility] at  832-8000 - they can help you locate a primary care doctor that  accepts your insurance, provides certain services, etc. °- Physician Referral Service- 1-800-533-3463 ° °Chronic Pain Problems: °Organization         Address  Phone   Notes  °[facility]  (336) 297-2271 Patients need to be referred by their primary care doctor.  ° °Medication Assistance: °Organization         Address  Phone   Notes  °[place] [facility] [address] °[place], [place] [address] (336) 641-8030 --Must be a resident of [place] °-- Must have NO insurance coverage whatsoever (no Medicaid/ Medicare, etc.) °-- The pt. MUST have a primary care doctor that directs their care regularly and follows them in the community °  °[facility]  (866) 331-1348   °[employer]  (888) 892-1162   ° °Agencies that provide inexpensive medical care: °Organization         Address  Phone   Notes  °[facility]  (336) 832-8035   °[facility]    (336) 832-7272   °[facility] [address] °[place], [place] [address] (336) 832-4777   °[facility] [address], °[employer] (336) 271-4999   °[facility]    (336) 373-0678   °[facility]    (336) 272-1050   °[facility] ° [address], [place] Phone:  (336)   832-4444, Fax:  (336) 832-4440 Hours of Operation:  9 am - 6 pm, M-F.  Also accepts Medicaid/Medicare and self-pay.  °[facility] ° [address], [place] Phone: (336) 832-3150, Fax: (336) 832-3151. Hours of Operation:  8:30 am - 5:30 pm, M-F.  Also accepts Medicaid and self-pay.  °[facility] [place] [address], [place] Phone: (336) 878-6027   °[facility] [address], [place], [place] (336)723-1848, Ext. 123 Mondays & Thursdays: 7-9 AM.  First  15 patients are seen on a first come, first serve basis. °  ° °Medicaid-accepting [place] Providers: ° °Organization         Address  Phone   Notes  °[facility] [address], [place] (336) 641-2100 Also accepts self-pay patients.  °[facility] [address], [place] ° (336) 856-9996   °[facility] [address], [place] (336) 288-8857   °[facility] [address] [place] [address], [place] (336) 299-7000   °Veita Bland [address], [place]  ° (336) 373-1557 Only accepts [place] Access Medicaid patients after they have their name applied to their card.  ° °Self-Pay (no insurance) in [place]: ° °Organization         Address  Phone   Notes  °Sickle Cell Patients, [facility] [address], [place] (336) 832-1970   °[facility] [address], [place] (336) 832-4400   °[facility] ° [address], [place] (336) 992-4800   °[facility]/Dr. Osei-Bonsu ° [address] [place] [address], [place] or [address], [place] (336) 841-8500 Phone number for both [place] and [place] locations is the same.  °[facility] [address], [place] (336) 299-0000   °[facility] [address] [place] [address], [place] or [address] (336) 852-7530 °(336) 878-2260   °[facility] [address], [place] (336) 350-1642, phone; (336) 294-5005, fax Sees patients 1st and 3rd Saturday of every month.  Must not qualify for public or private insurance (i.e. Medicaid, Medicare, [place] Health Choice, Veterans' Benefits) • Household income should be no more than 200% of the poverty level •The clinic cannot treat you if you are pregnant or think you are pregnant • Sexually transmitted diseases are not treated at the clinic.  ° ° °Dental  Care: °Organization         Address  Phone  Notes  °[facility] [address], [place] (336) 641-6152 Accepts children up to age 21 who are enrolled in Medicaid or [place] Health Choice; pregnant women with a Medicaid card; and children who have applied for Medicaid or [place] Health Choice, but were declined, whose parents can pay a reduced fee at time of service.  °[facility] [place]  [address], [place] (336) 641-7733 Accepts children up to age 21 who are enrolled in Medicaid or [place] Health Choice; pregnant women with a Medicaid card; and children who have applied for Medicaid or [place] Health Choice, but were declined, whose parents can pay a reduced fee at time of service.  °[facility] ° [address],  (336) 641-4533 Patients are seen by appointment only. Walk-ins are   not accepted. [facility] will see patients 18 years of age and older. °Monday - Tuesday (8am-5pm) °Most Wednesdays (8:30-5pm) °$30 per visit, cash only  °[facility] ° [address], [place] (336) 641-4533 Patients are seen by appointment only. Walk-ins are not accepted. [facility] will see patients 18 years of age and older. °One Wednesday Evening (Monthly: Volunteer Based).  $30 per visit, cash only  °[facility]  (919) 537-3737 for adults; Children under age 4, call Graduate Pediatric Dentistry at ([phone]) [phone]. Children aged 4-14, please call (919) [phone] to request a pediatric application. ° Dental services are provided in all areas of dental care including fillings, crowns and bridges, complete and partial dentures, implants, gum treatment, root canals, and extractions. Preventive care is also provided. Treatment is provided to both adults and children. °Patients are selected via a lottery and there is often a waiting list. °  °[facility] [address], °[place] ° (336) 763-8833 www.drcivils.com °  °[facility] [address], [place], [place] (336)723-1848, Ext. 123 Second and Fourth Thursday of each month, opens at 6:30 AM; Clinic ends at 9 AM.  Patients are seen on a first-come first-served basis, and a limited number are seen during each clinic.  ° °[facility] ° [address], [place], [place] (336) 723-7904   Eligibility Requirements °You must have lived in [place], [place], or [place] counties for at least the last three months. °  You cannot be eligible for state or federal sponsored healthcare insurance, including Veterans Administration, Medicaid, or Medicare. °  You generally cannot be eligible for healthcare insurance through your employer.  °  How to apply: °Eligibility screenings are held every Tuesday and Wednesday afternoon from 1:00 pm until 4:00 pm. You do not need an appointment for the interview!  °[facility] [address], [place], [place] 336-631-2330   °[facility]  336-342-8273   °[facility]  336-703-3100   °[facility]  336-570-6415   ° °Behavioral Health Resources in the Community: °Intensive Outpatient Programs °Organization         Address  Phone  Notes  °[facility] [address], [place], [place] 336-878-6098   °[facility] [address], [place], [place] 336-832-9800   °[facility] [address], [place], [place] ° 336-882-2125   °[facility] [address],  °[place], [place] 1-800-853-5163 or 336-641-4981   °Substance Abuse Resources °Organization         Address  Phone  Notes  °[facility]  336-882-2125   °[facility]  336-784-9470   °[facility]  336-285-9073   °[facility]  336-845-3988   °Residential & Outpatient Substance Abuse Program  1-800-659-3381    °Psychological Services °Organization         Address  Phone  Notes  °[facility]  336- 832-9600   °[facility]  336- 378-7881   °[facility] [address], [place] 1-800-853-5163 or 336-641-4981   ° °Mobile Crisis Teams °Organization         Address  Phone  Notes  °[facility], Mobile Crisis Care Unit  1-877-626-1772   °Assertive °[facility] ° [address] [place], [place] 336-834-9664   °Sharon DeEsch [address] ° [place] 336-554-5454   ° °Self-Help/Support   Groups °Organization         Address  Phone             Notes  °[facility] - variety of support groups  336- 373-1402 Call for more information  °Narcotics Anonymous (NA), [facility] [address], °[place]  2 meetings at this location  ° °Residential Treatment Programs °Organization         Address  Phone  Notes  °[facility] [address],    °[place]  1-866-801-8205   °[facility] ° [address], [place], [place] 704-293-8524   °[facility] [address], [place] 336-845-3988 Admissions: 8am-3pm M-F  °[facility] [address]    °[place], [place] 336-841-1104   °[facility] [address], [place], [place] 336-379-7146   °[facility] [address]  °[place], [place] 336-285-9073   °[facility] - Intensive Outpatient [address], [place], [place] 336-852-3033   °[facility] [address]  °[place], [place] 1-877-615-2722 or 336-784-9470   °[facility] [address] [place], [place] 336-227-7417 Accepts Medicaid  °[facility] [address]  °[place] 1-800-659-3381 Substance Abuse/Addiction Treatment  ° °[place] Behavioral Health Resources °Organization         Address  Phone  Notes  °[facility]  (888) 581-9988   °Julie Brannon, PhD [address] [place], [place]   (336) 349-5553 or (336) 951-0000   °[facility]   [address] °[place], [place] (336) 349-4454   °[facility] [address], [place], [place] (336) 342-8316 Insurance/Medicaid/sponsorship through [facility]  °[facility] [address]                                    [place], [place] (336) 342-8316 Therapy/tele-psych/case  °[facility] [address]  ° [place], [place] (336) 349-2233    °Dr. Arfeen  (336) 349-4544   °[facility] [place]  [employer] [place] [facility] 1) [address], [place] °2) [address], [place] °3)  [address], [place] (336) 349-3220 °(336) 342-7768 ° °(336) 342-8140   °[place] [facility] (336) 342-1394 or (336) 342-3537 (After Hours)    ° ° ° °
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
## 2013-09-16 NOTE — Progress Notes (Signed)
CSW spoke with Pt's friend, Alycia RossettiRyan, who stated that she cannot pick up Pt, nor can he stay with her.  Pt made aware and stated that he'd like to go to [facility].  CSW [facility] with [facility]inda at [facility].  Valinda aware that Pt has no ID and that he uses a wheelchair.  Mechele CollinValinda stated that is was fine to send Pt there.  CSW arranged for a taxi to pick up Pt and transport him to [facility].  Pt to be d/c'd.  Providence CrosbyAmanda Rylie Knierim, LCSW Clinical Social Work [phone]

## 2013-09-20 NOTE — ED Provider Notes (Signed)
Medical screening examination/treatment/procedure(s) were performed by non-physician practitioner and as supervising physician I was immediately available for consultation/collaboration.   EKG Interpretation None       James NielsenBrian Berniece Abid, MD 09/20/13 2302

## 2013-09-26 ENCOUNTER — Emergency Department (INDEPENDENT_AMBULATORY_CARE_PROVIDER_SITE_OTHER): Payer: Medicaid Other

## 2013-09-26 ENCOUNTER — Encounter (HOSPITAL_COMMUNITY): Payer: Self-pay | Admitting: Emergency Medicine

## 2013-09-26 ENCOUNTER — Emergency Department (INDEPENDENT_AMBULATORY_CARE_PROVIDER_SITE_OTHER)
Admission: EM | Admit: 2013-09-26 | Discharge: 2013-09-26 | Disposition: A | Discharge status: Home / Self Care | Payer: Medicaid Other | Source: Home / Self Care | Attending: Family Medicine | Admitting: Family Medicine

## 2013-09-26 DIAGNOSIS — M62838 Other muscle spasm: Secondary | ICD-10-CM

## 2013-09-26 MED ORDER — TRAMADOL HCL 50 MG PO TABS: 50.0000 mg | ORAL_TABLET | Freq: Four times a day (QID) | ORAL | Status: DC | PRN

## 2013-09-26 MED ORDER — CYCLOBENZAPRINE HCL 5 MG PO TABS: 5.0000 mg | ORAL_TABLET | Freq: Three times a day (TID) | ORAL | Status: DC | PRN

## 2013-09-26 MED ORDER — IBUPROFEN 800 MG PO TABS: 800.0000 mg | ORAL_TABLET | Freq: Three times a day (TID) | ORAL | Status: DC | PRN

## 2013-09-26 NOTE — Discharge Instructions (Signed)
Muscle Cramps and Spasms  Muscle cramps and spasms are when muscles tighten by themselves. They usually get better within minutes. Muscle cramps are painful. They are usually stronger and last longer than muscle spasms. Muscle spasms may or may not be painful. They can last a few seconds or much longer.  HOME CARE  · Drink enough fluid to keep your pee (urine) clear or pale yellow.  · Massage, stretch, and relax the muscle.  · Use a warm towel, heating pad, or warm shower water on tight muscles.  · Place ice on the muscle if it is tender or in pain.  · Put ice in a plastic bag.  · Place a towel between your skin and the bag.  · Leave the ice on for 15-20 minutes, 03-04 times a day.  · Only take medicine as told by your doctor.  GET HELP RIGHT AWAY IF:   Your cramps or spasms get worse, happen more often, or do not get better with time.  MAKE SURE YOU:  · Understand these instructions.  · Will watch your condition.  · Will get help right away if you are not doing well or get worse.  Document Released: 04/01/2008 Document Revised: 08/14/2012 Document Reviewed: 04/05/2012  ExitCare® Patient Information ©2014 [employer].
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
## 2013-09-27 NOTE — ED Provider Notes (Signed)
Medical screening examination/treatment/procedure(s) were performed by a resident physician or non-physician practitioner and as the supervising physician I was immediately available for consultation/collaboration.  Guido Comp, MD    Nacole Fluhr S Gerica Koble, MD 09/27/13 0748 

## 2013-10-09 ENCOUNTER — Telehealth: Payer: Self-pay | Admitting: Family Medicine

## 2013-10-09 NOTE — Telephone Encounter (Signed)
Grandmother called and would like orders to have a new wheelchair for [place] sent to [facility]. jw

## 2013-10-15 NOTE — Telephone Encounter (Signed)
Grandmother calls again today. Original message fron 10/09/13 never forward to Dr. Aviva SignsPiloto. Spoke with Dr. Aviva SignsPiloto and she will take care of it as soon as she can.

## 2013-10-18 NOTE — Telephone Encounter (Signed)
Just a written rx is needed due to him being already being in a wheelchair. Just needing a new rx for ne wheelchair

## 2013-10-22 NOTE — Telephone Encounter (Signed)
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 They just needs notes on why he is using a wheelchair for mobility. [facility] [place] [phone]. jw

## 2013-10-22 NOTE — Telephone Encounter (Signed)
Faxed a rx to [facility] for Dr. Aviva SignsPiloto

## 2013-10-23 NOTE — Telephone Encounter (Signed)
Please advise.thank you. [name], James Richard  

## 2013-10-24 ENCOUNTER — Encounter: Payer: Self-pay | Admitting: Family Medicine

## 2013-10-24 ENCOUNTER — Ambulatory Visit (INDEPENDENT_AMBULATORY_CARE_PROVIDER_SITE_OTHER): Payer: Medicaid Other | Admitting: Family Medicine

## 2013-10-24 VITALS — BP 128/84 | HR 98 | Temp 98.7°F

## 2013-10-24 DIAGNOSIS — G8221 Paraplegia, complete: Secondary | ICD-10-CM

## 2013-10-24 DIAGNOSIS — G8220 Paraplegia, unspecified: Secondary | ICD-10-CM | POA: Insufficient documentation

## 2013-10-24 NOTE — Patient Instructions (Signed)
It was nice seeing you today James Richard, please call if you have any problems obtaining a new wheelchair.  Thanks, Dr. Paulina FusiHess
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
## 2013-11-19 ENCOUNTER — Emergency Department (HOSPITAL_COMMUNITY)
Admission: EM | Admit: 2013-11-19 | Discharge: 2013-11-19 | Disposition: A | Discharge status: Home / Self Care | Payer: Medicaid Other | Attending: Emergency Medicine | Admitting: Emergency Medicine

## 2013-11-19 ENCOUNTER — Encounter (HOSPITAL_COMMUNITY): Payer: Self-pay | Admitting: Emergency Medicine

## 2013-11-19 DIAGNOSIS — M545 Low back pain, unspecified: Secondary | ICD-10-CM | POA: Diagnosis not present

## 2013-11-19 DIAGNOSIS — Z760 Encounter for issue of repeat prescription: Secondary | ICD-10-CM | POA: Insufficient documentation

## 2013-11-19 DIAGNOSIS — F17200 Nicotine dependence, unspecified, uncomplicated: Secondary | ICD-10-CM | POA: Diagnosis not present

## 2013-11-19 DIAGNOSIS — G8929 Other chronic pain: Secondary | ICD-10-CM

## 2013-11-19 DIAGNOSIS — Z8659 Personal history of other mental and behavioral disorders: Secondary | ICD-10-CM | POA: Diagnosis not present

## 2013-11-19 DIAGNOSIS — I10 Essential (primary) hypertension: Secondary | ICD-10-CM | POA: Insufficient documentation

## 2013-11-19 DIAGNOSIS — Z8669 Personal history of other diseases of the nervous system and sense organs: Secondary | ICD-10-CM | POA: Insufficient documentation

## 2013-11-19 DIAGNOSIS — G8921 Chronic pain due to trauma: Secondary | ICD-10-CM | POA: Diagnosis not present

## 2013-11-19 DIAGNOSIS — M549 Dorsalgia, unspecified: Secondary | ICD-10-CM

## 2013-11-19 MED ORDER — CYCLOBENZAPRINE HCL 5 MG PO TABS: 5.0000 mg | ORAL_TABLET | Freq: Three times a day (TID) | ORAL | Status: DC | PRN

## 2013-11-19 MED ORDER — TRAMADOL HCL 50 MG PO TABS: 50.0000 mg | ORAL_TABLET | Freq: Four times a day (QID) | ORAL | Status: DC | PRN

## 2013-11-19 NOTE — ED Notes (Signed)
Discharge instructions reviewed with pt. Pt verbalized understanding.   

## 2013-11-19 NOTE — Discharge Instructions (Signed)
Take tramadol as directed as needed for pain. No driving or operating heavy machinery while taking flexeril. This medication may make you drowsy.  Chronic Back Pain  When back pain lasts longer than 3 months, it is called chronic back pain.People with chronic back pain often go through certain periods that are more intense (flare-ups).  CAUSES Chronic back pain can be caused by wear and tear (degeneration) on different structures in your back. These structures include:  The bones of your spine (vertebrae) and the joints surrounding your spinal cord and nerve roots (facets).  The strong, fibrous tissues that connect your vertebrae (ligaments). Degeneration of these structures may result in pressure on your nerves. This can lead to constant pain. HOME CARE INSTRUCTIONS  Avoid bending, heavy lifting, prolonged sitting, and activities which make the problem worse.  Take brief periods of rest throughout the day to reduce your pain. Lying down or standing usually is better than sitting while you are resting.  Take over-the-counter or prescription medicines only as directed by your caregiver. SEEK IMMEDIATE MEDICAL CARE IF:   You have weakness or numbness in one of your legs or feet.  You have trouble controlling your bladder or bowels.  You have nausea, vomiting, abdominal pain, shortness of breath, or fainting. Document Released: 05/27/2004 Document Revised: 07/12/2011 Document Reviewed: 04/03/2011 [facility] Patient Information 2015 [place], [place]. This information is not intended to replace advice given to you by your health care provider. Make sure you discuss any questions you have with your health care provider.  Back Pain, Adult Low back pain is very common. About 1 in 5 people have back pain.The cause of low back pain is rarely dangerous. The pain often gets better over time.About half of people with a sudden onset of back pain feel better in just 2 weeks. About 8 in 10 people feel  better by 6 weeks.  CAUSES Some common causes of back pain include:  Strain of the muscles or ligaments supporting the spine.  Wear and tear (degeneration) of the spinal discs.  Arthritis.  Direct injury to the back. DIAGNOSIS Most of the time, the direct cause of low back pain is not known.However, back pain can be treated effectively even when the exact cause of the pain is unknown.Answering your caregiver's questions about your overall health and symptoms is one of the most accurate ways to make sure the cause of your pain is not dangerous. If your caregiver needs more information, he or she may order lab work or imaging tests (X-rays or MRIs).However, even if imaging tests show changes in your back, this usually does not require surgery. HOME CARE INSTRUCTIONS For many people, back pain returns.Since low back pain is rarely dangerous, it is often a condition that people can learn to [facility] their own.   Remain active. It is stressful on the back to sit or stand in one place. Do not sit, drive, or stand in one place for more than 30 minutes at a time. Take short walks on level surfaces as soon as pain allows.Try to increase the length of time you walk each day.  Do not stay in bed.Resting more than 1 or 2 days can delay your recovery.  Do not avoid exercise or work.Your body is made to move.It is not dangerous to be active, even though your back may hurt.Your back will likely heal faster if you return to being active before your pain is gone.  Pay attention to your body when you bend and lift.  Many people have less discomfortwhen lifting if they bend their knees, keep the load close to their bodies,and avoid twisting. Often, the most comfortable positions are those that put less stress on your recovering back.  Find a comfortable position to sleep. Use a firm mattress and lie on your side with your knees slightly bent. If you lie on your back, put a pillow under your  knees.  Only take over-the-counter or prescription medicines as directed by your caregiver. Over-the-counter medicines to reduce pain and inflammation are often the most helpful.Your caregiver may prescribe muscle relaxant drugs.These medicines help dull your pain so you can more quickly return to your normal activities and healthy exercise.  Put ice on the injured area.  Put ice in a plastic bag.  Place a towel between your skin and the bag.  Leave the ice on for 15-20 minutes, 03-04 times a day for the first 2 to 3 days. After that, ice and heat may be alternated to reduce pain and spasms.  Ask your caregiver about trying back exercises and gentle massage. This may be of some benefit.  Avoid feeling anxious or stressed.Stress increases muscle tension and can worsen back pain.It is important to recognize when you are anxious or stressed and learn ways to manage it.Exercise is a great option. SEEK MEDICAL CARE IF:  You have pain that is not relieved with rest or medicine.  You have pain that does not improve in 1 week.  You have new symptoms.  You are generally not feeling well. SEEK IMMEDIATE MEDICAL CARE IF:   You have pain that radiates from your back into your legs.  You develop new bowel or bladder control problems.  You have unusual weakness or numbness in your arms or legs.  You develop nausea or vomiting.  You develop abdominal pain.  You feel faint. Document Released: 04/19/2005 Document Revised: 10/19/2011 Document Reviewed: 09/07/2010 [facility] Patient Information 2015 [place], [place]. This information is not intended to replace advice given to you by your health care provider. Make sure you discuss any questions you have with your health care provider.
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
## 2013-11-19 NOTE — ED Provider Notes (Signed)
Medical screening examination/treatment/procedure(s) were performed by non-physician practitioner and as supervising physician I was immediately available for consultation/collaboration.    Merik Mignano, MD 11/19/13 0753 

## 2013-12-03 ENCOUNTER — Telehealth: Payer: Self-pay | Admitting: Family Medicine

## 2013-12-03 MED ORDER — CRUTCHES-ALUMINUM MISC: 1.0000 | Status: DC | PRN

## 2013-12-03 MED ORDER — ANKLE BRACE ADJUST-TO-FIT MISC: 2.0000 | Status: DC | PRN

## 2013-12-03 NOTE — Telephone Encounter (Signed)
Requesting RX for new crutches and AFOs. Please call Grandmother once ready.

## 2013-12-03 NOTE — Telephone Encounter (Signed)
Printed and ready for pick up.  Thanks Tesoro CorporationBryan R. Paulina FusiHess, DO of [facility] 12/03/2013, 12:11 PM

## 2013-12-03 NOTE — Telephone Encounter (Signed)
Please advise.Thank you.James Richard S  

## 2013-12-04 NOTE — Telephone Encounter (Signed)
Patient informed. James Richard  

## 2013-12-17 ENCOUNTER — Encounter: Payer: Self-pay | Admitting: Family Medicine

## 2013-12-17 NOTE — Progress Notes (Signed)
Faxed out to below number

## 2013-12-17 NOTE — Progress Notes (Signed)
Pt's grandmother stopped by and stated that more info needed to be on prescription as far as forearm crutches and it can be faxed to [phone].  Given to Jimmy FootmanSara Evans for fax.  Twana FirstBryan R. Paulina FusiHess, DO of [facility] 12/17/2013, 12:27 PM

## 2013-12-18 ENCOUNTER — Telehealth: Payer: Self-pay | Admitting: Family Medicine

## 2013-12-18 NOTE — Telephone Encounter (Signed)
refaxed to number [phone] and also [facility] [phone]

## 2013-12-18 NOTE — Telephone Encounter (Signed)
Mother called and wanted the doctor to know that she needs arm crutches for Elige RadonBradley and also that she needs the forms filled out. Please call her with any questions jw

## 2014-09-05 ENCOUNTER — Emergency Department (HOSPITAL_COMMUNITY)
Admission: EM | Admit: 2014-09-05 | Discharge: 2014-09-05 | Disposition: A | Discharge status: Home / Self Care | Payer: Medicaid Other | Attending: Emergency Medicine | Admitting: Emergency Medicine

## 2014-09-05 ENCOUNTER — Encounter (HOSPITAL_COMMUNITY): Payer: Self-pay | Admitting: Emergency Medicine

## 2014-09-05 ENCOUNTER — Emergency Department (HOSPITAL_COMMUNITY): Payer: Medicaid Other

## 2014-09-05 DIAGNOSIS — S62306A Unspecified fracture of fifth metacarpal bone, right hand, initial encounter for closed fracture: Secondary | ICD-10-CM

## 2014-09-05 DIAGNOSIS — Z8669 Personal history of other diseases of the nervous system and sense organs: Secondary | ICD-10-CM | POA: Diagnosis not present

## 2014-09-05 DIAGNOSIS — Z720 Tobacco use: Secondary | ICD-10-CM | POA: Insufficient documentation

## 2014-09-05 DIAGNOSIS — Z8659 Personal history of other mental and behavioral disorders: Secondary | ICD-10-CM | POA: Insufficient documentation

## 2014-09-05 DIAGNOSIS — W228XXA Striking against or struck by other objects, initial encounter: Secondary | ICD-10-CM | POA: Diagnosis not present

## 2014-09-05 DIAGNOSIS — Z79899 Other long term (current) drug therapy: Secondary | ICD-10-CM | POA: Diagnosis not present

## 2014-09-05 DIAGNOSIS — Y998 Other external cause status: Secondary | ICD-10-CM | POA: Insufficient documentation

## 2014-09-05 DIAGNOSIS — S6991XA Unspecified injury of right wrist, hand and finger(s), initial encounter: Secondary | ICD-10-CM | POA: Diagnosis present

## 2014-09-05 DIAGNOSIS — F1010 Alcohol abuse, uncomplicated: Secondary | ICD-10-CM | POA: Insufficient documentation

## 2014-09-05 DIAGNOSIS — S60511A Abrasion of right hand, initial encounter: Secondary | ICD-10-CM | POA: Diagnosis not present

## 2014-09-05 DIAGNOSIS — Y9289 Other specified places as the place of occurrence of the external cause: Secondary | ICD-10-CM | POA: Diagnosis not present

## 2014-09-05 DIAGNOSIS — S60512A Abrasion of left hand, initial encounter: Secondary | ICD-10-CM | POA: Diagnosis not present

## 2014-09-05 DIAGNOSIS — I10 Essential (primary) hypertension: Secondary | ICD-10-CM | POA: Diagnosis not present

## 2014-09-05 DIAGNOSIS — Y9389 Activity, other specified: Secondary | ICD-10-CM | POA: Insufficient documentation

## 2014-09-05 DIAGNOSIS — S62326A Displaced fracture of shaft of fifth metacarpal bone, right hand, initial encounter for closed fracture: Secondary | ICD-10-CM | POA: Insufficient documentation

## 2014-09-05 MED ORDER — HYDROCODONE-ACETAMINOPHEN 5-325 MG PO TABS: 1.0000 | ORAL_TABLET | ORAL | Status: DC | PRN

## 2014-09-05 MED ORDER — NAPROXEN 500 MG PO TABS: 500.0000 mg | ORAL_TABLET | Freq: Two times a day (BID) | ORAL | Status: DC

## 2014-09-05 MED ORDER — HYDROCODONE-ACETAMINOPHEN 5-325 MG PO TABS
1.0000 | ORAL_TABLET | Freq: Once | ORAL | Status: AC
  Administered 2014-09-05: 1 via ORAL
  Filled 2014-09-05: qty 1
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
## 2014-09-05 NOTE — Progress Notes (Signed)
Orthopedic Tech Progress Note Patient Details:  James MorinBradley Richard July 14, 1990 [id number]  Ortho Devices Type of Ortho Device: Ulna gutter splint Ortho Device/Splint Interventions: Application   Haskell Flirtewsome, Aarav Burgett M 09/05/2014, 1:46 AM

## 2014-09-05 NOTE — Discharge Instructions (Signed)
Please follow the directions provided. Be sure to follow-up with hand doctor for further evaluation of the fracture in your right hand. He may use naproxen twice a day to help with pain and inflammation. You may use Vicodin for pain not relieved by the naproxen. Please keep your splint clean and dry. Don't hesitate to return for any new, worsening, or concerning symptoms.    SEEK IMMEDIATE MEDICAL CARE IF:  You develop a rash, have difficulty breathing, or have any allergy problems.  If you do not have a window in your cast for observing the wound, a discharge or minor bleeding may show up as a stain on the outside of your cast. Report these findings to your caregiver.   Emergency Department Resource Guide 1) Find a Doctor and Pay Out of Pocket Although you won't have to find out who is covered by your insurance plan, it is a good idea to ask around and get recommendations. You will then need to call the office and see if the doctor you have chosen will accept you as a new patient and what types of options they offer for patients who are self-pay. Some doctors offer discounts or will set up payment plans for their patients who do not have insurance, but you will need to ask so you aren't surprised when you get to your appointment.  2) Contact Your Local Health Department Not all health departments have doctors that can see patients for sick visits, but many do, so it is worth a call to see if yours does. If you don't know where your local health department is, you can check in your phone book. The CDC also has a tool to help you locate your state's health department, and many state websites also have listings of all of their local health departments.  3) Find a Walk-in Clinic If your illness is not likely to be very severe or complicated, you may want to try a walk in clinic. These are popping up all over the country in pharmacies, drugstores, and shopping centers. They're usually staffed by nurse  practitioners or physician assistants that have been trained to treat common illnesses and complaints. They're usually fairly quick and inexpensive. However, if you have serious medical issues or chronic medical problems, these are probably not your best option.  No Primary Care Doctor: - Call Health Connect at  [phone] - they can help you locate a primary care doctor that  accepts your insurance, provides certain services, etc. - Physician Referral Service- [phone]  Chronic Pain Problems: Organization         Address  Phone   Notes  [facility]  [phone] Patients need to be referred by their primary care doctor.   Medication Assistance: Organization         Address  Phone   Notes  [facility] Medication [facility] [address] [place] [place], [place] [address] [phone] --Must be a resident of [facility] -- Must have NO insurance coverage whatsoever (no Medicaid/ Medicare, etc.) -- The pt. MUST have a primary care doctor that directs their care regularly and follows them in the community   MedAssist  [phone]   [employer]  [phone]    Agencies that provide inexpensive medical care: Organization         Address  Phone   Notes  [facility]  [phone]   [facility]    [phone]  [facility] [address] [place], [place] [address] [phone]   [facility] [address], [place] [phone]) [phone]   Planned Parenthood    [phone]   [facility]    [phone]   [facility]  [address], [place] Phone:  [phone]) [phone], Fax:  [phone]) [phone] Hours of Operation:  9 am - 6 pm, M-F.  Also accepts Medicaid/Medicare and self-pay.  [facility]  [address], [place] Phone: [phone], Fax: [phone]. Hours of Operation:  8:30 am -  5:30 pm, M-F.  Also accepts Medicaid and self-pay.  [facility] [place] [address], [place] Phone: [phone](336) [phone]   [facility] [address], [place], [place] [phone], Ext. [phone] Mondays & Thursdays: 7-9 AM.  First 15 patients are seen on a first come, first serve basis.    Medicaid-accepting [facility] Providers:  Organization         Address  Phone   Notes  [facility] [address], [place] [phone]) [phone] Also accepts self-pay patients.  [facility] [address] [place], [place]  [phone]) [phone]   [facility] [address], [place] [phone]) [phone]   [facility] [address] [place] [address], [place] [phone]) [phone]   Renaye RakersVeita Bland [address], [place]   [phone]) [phone] Only accepts [place] Access [place] patients after they have their name applied to their card.   Self-Pay (no insurance) in [facility]:  Organization         Address  Phone   Notes  Sickle Cell Patients, [facility] [address], [place] [phone]) [phone]   [facility] [address], [place] [phone]) [phone]   [facility] [place]  [address], [place] [phone]   [facility]/Dr. Osei-Bonsu  [address] [place] [address], [place] or [phone], [place] [phone]) [phone] Phone number for both [place] and [place] locations is the same.  [facility] [address], [place] [phone]) [phone]   [facility] [place] [address] [place] [address], [place] or [address] [phone]   [facility] [address], [place] [phone]) [phone], phone; [phone]) [phone], fax Sees patients 1st and 3rd Saturday of every month.  Must not qualify for public or private insurance (i.e. Medicaid, Medicare, [place] Health Choice,  Veterans' Benefits)  Household income should be no more than 200% of the poverty level The clinic cannot treat you if you are pregnant or think you are pregnant  Sexually transmitted diseases are not treated at the clinic.    Dental Care: Organization         Address  Phone  Notes  [facility] [address], [place] [phone]) [phone] Accepts children up to age 25 who are enrolled in [place]icaid or [place] Health Choice; pregnant women with a Medicaid card; and children who have applied for Medicaid or [place] Health Choice, but were declined, whose parents can pay a reduced fee at time of service.  [facility] of [facility] [place]  [address], [place] [phone]) [phone] Accepts children up to age 25 who  are enrolled in Medicaid or [place] Health Choice; pregnant women with a Medicaid card; and children who have applied for Medicaid or  Health Choice, but were declined, whose parents can pay a reduced fee at time of service.  [facility]  [address], [place] [phone]) [phone] Patients are seen by appointment only. Walk-ins are not accepted. [facility] will see patients 25 years of age and older. Monday - Tuesday (8am-5pm) Most Wednesdays (8:30-5pm) $30 per visit, cash only  [facility]  [address], [facility] [phone]) [phone] Patients are seen by appointment only. Walk-ins are not accepted. [facility] will see patients 25 years of age and older. One Wednesday Evening (Monthly: Volunteer Based).  $30 per visit, cash only  [facility] [facility]  [phone] for adults; Children under age 304, call Graduate Pediatric Dentistry at [phone]) [phone]. Children aged 544-14, please call [phone] to request a pediatric application.  Dental services are provided in all areas of dental care including fillings, crowns and bridges, complete and  partial dentures, implants, gum treatment, root canals, and extractions. Preventive care is also provided. Treatment is provided to both adults and children. Patients are selected via a lottery and there is often a waiting list.   [facility] [address], [place]  [phone]) [phone] [email or web address]   [facility] [address], [place], [place] [phone], Ext. [phone] Second and Fourth Thursday of each month, opens at 6:30 AM; Clinic ends at 9 AM.  Patients are seen on a first-come first-served basis, and a limited number are seen during each clinic.   [facility]  [address], [place], [place] [phone]) [phone]   Eligibility Requirements You must have lived in [place], [place], or [place] counties for at least the last three months.   You cannot be eligible for state or federal sponsored National Cityhealthcare insurance, including CIGNAVeterans Administration, [place]icaid, or [employer] EntertainmentMedicare.   You generally cannot be eligible for healthcare insurance through your employer.    How to apply: Eligibility screenings are held every Tuesday and Wednesday afternoon from 1:00 pm until 4:00 pm. You do not need an appointment for the interview!  [facility] [address], [place], [place] [phone]   [facility]  [phone]   [facility]  [phone]   [facility]  [phone]    Behavioral Health Resources in the Community: Intensive Outpatient Programs Organization         Address  Phone  Notes  [facility] [address], [place], [place] [phone]   [facility] [address], [place], [place] [phone]   [facility] [address], [place], [place]  [phone]   [facility] [address],  [place], [place] [phone] or [phone]   Substance Abuse Resources Organization          Address  Phone  Notes  [facility]  [phone]   [facility]  [phone]   [facility]  [phone]   [facility]  [phone]   Residential & Outpatient Substance Abuse Program  [phone]   Psychological Services Organization         Address  Phone  Notes  Dr [facility]  [phone]   [facility]  [phone]- [phone]   [facility] [address],  [place] [phone] or [phone]    Mobile Crisis Teams Organization         Address  Phone  Notes  [facility], Mobile Crisis Care Unit  [phone]   [facility]  [address] [place], [place]   [place] Levels [address], [place] [phone]    Self-Help/Support Groups Organization         Address  Phone             Notes  [place]. of [place] - variety of support groups  [place] Call for more information  Narcotics Anonymous (NA), [facility]aring [facility] [address], [employer] [place]  2 meetings at this location   Special educational needs teacher         Address  Phone  Notes  [facility]idential [facility] [place],    [employer]  [phone]   [facility]  [address], [place] [address], [place], [place]   Ricardo [place], [place] [phone] Admissions: 8am-3pm M-F  Incentives Substance [place] [address],    [place], [place] [phone]   [facility] [address] Roy, [place], [place]   [facility] [address],  [place], [place] Choice   Insight Programs - Intensive Outpatient [address], [place], [place]   [facility] ([place] [address].,  [place], [place] [phone] or [phone]   [facility] (RTS) [address] [place], [place] Accepts Medicaid  [facility]ellow[facility] Rodri­guez Hevia [address]  [place] [phone] Substance Abuse/Addiction Treatment   [facility] Organization         Address  Phone  Notes  [facility]  [phone]   Domenic Schwab, PhD [address] Port[address] [place], [place]   [phone] or [phone]   [place], [place] [phone]   [facility] [address], [place], [place] [phone] Insurance/Medicaid/sponsorship through [facility] and Families [address]                                    [place], [place] [phone] [place] [address], [place] [phone]    Dr. Adele Schilder  [phone]   [facility]. 1) [address] [place] 2) [place] 3)  [place] [address], [place] [phone]  [phone]   [place] [phone] or [phone] (After Hours)
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
## 2014-09-16 ENCOUNTER — Telehealth: Payer: Self-pay | Admitting: Family Medicine

## 2014-09-16 DIAGNOSIS — S62318S Displaced fracture of base of other metacarpal bone, sequela: Secondary | ICD-10-CM

## 2014-09-16 NOTE — Telephone Encounter (Signed)
Pt mother is calling because the [facility] a [facility] and he needs a referral sent to Dr. Carvel GettingMatthew Wiengold; at [facility] [place]; [phone]; address: [address], [place], [id number]. She would like to receive a phone once this is complete. Thank you, Dorothey BasemanSadie Reynolds, ASA

## 2014-09-16 NOTE — Telephone Encounter (Signed)
I am covering for Dr. Paulina FusiHess who is away from the office.  Referral entered. Please inform pt.  James DodrillBrittany J Gwynneth Fabio, MD

## 2014-09-16 NOTE — Telephone Encounter (Signed)
Patient has medicaid and can not been seen with a referral from PCP.  Will forward to MD to place. Liza Czerwinski,CMA

## 2014-09-18 ENCOUNTER — Other Ambulatory Visit: Payer: Self-pay | Admitting: Orthopedic Surgery

## 2014-09-19 ENCOUNTER — Ambulatory Visit (HOSPITAL_BASED_OUTPATIENT_CLINIC_OR_DEPARTMENT_OTHER)
Admission: RE | Admit: 2014-09-19 | Discharge: 2014-09-19 | Disposition: A | Discharge status: Home / Self Care | Payer: Medicaid Other | Source: Ambulatory Visit | Attending: Orthopedic Surgery | Admitting: Orthopedic Surgery

## 2014-09-19 ENCOUNTER — Encounter (HOSPITAL_BASED_OUTPATIENT_CLINIC_OR_DEPARTMENT_OTHER)
Admission: RE | Disposition: A | Discharge status: Home / Self Care | Payer: Self-pay | Source: Ambulatory Visit | Attending: Orthopedic Surgery

## 2014-09-19 ENCOUNTER — Ambulatory Visit (HOSPITAL_BASED_OUTPATIENT_CLINIC_OR_DEPARTMENT_OTHER): Payer: Medicaid Other | Admitting: Certified Registered"

## 2014-09-19 ENCOUNTER — Encounter (HOSPITAL_BASED_OUTPATIENT_CLINIC_OR_DEPARTMENT_OTHER): Payer: Self-pay | Admitting: *Deleted

## 2014-09-19 DIAGNOSIS — W2209XA Striking against other stationary object, initial encounter: Secondary | ICD-10-CM | POA: Diagnosis not present

## 2014-09-19 DIAGNOSIS — G709 Myoneural disorder, unspecified: Secondary | ICD-10-CM | POA: Diagnosis not present

## 2014-09-19 DIAGNOSIS — Y929 Unspecified place or not applicable: Secondary | ICD-10-CM | POA: Insufficient documentation

## 2014-09-19 DIAGNOSIS — I10 Essential (primary) hypertension: Secondary | ICD-10-CM | POA: Diagnosis not present

## 2014-09-19 DIAGNOSIS — F329 Major depressive disorder, single episode, unspecified: Secondary | ICD-10-CM | POA: Insufficient documentation

## 2014-09-19 DIAGNOSIS — Z79899 Other long term (current) drug therapy: Secondary | ICD-10-CM | POA: Insufficient documentation

## 2014-09-19 DIAGNOSIS — S62326A Displaced fracture of shaft of fifth metacarpal bone, right hand, initial encounter for closed fracture: Secondary | ICD-10-CM | POA: Diagnosis present

## 2014-09-19 DIAGNOSIS — F17210 Nicotine dependence, cigarettes, uncomplicated: Secondary | ICD-10-CM | POA: Diagnosis not present

## 2014-09-19 HISTORY — DX: Gastro-esophageal reflux disease without esophagitis: K21.9

## 2014-09-19 HISTORY — PX: OPEN REDUCTION INTERNAL FIXATION (ORIF) METACARPAL: SHX6234

## 2014-09-19 SURGERY — OPEN REDUCTION INTERNAL FIXATION (ORIF) METACARPAL
Anesthesia: General | Site: Finger | Laterality: Right

## 2014-09-19 MED ORDER — DEXAMETHASONE SODIUM PHOSPHATE 10 MG/ML IJ SOLN
INTRAMUSCULAR | Status: DC | PRN
  Administered 2014-09-19: 10 mg via INTRAVENOUS

## 2014-09-19 MED ORDER — CHLORHEXIDINE GLUCONATE 4 % EX LIQD: 60.0000 mL | Freq: Once | CUTANEOUS | Status: DC

## 2014-09-19 MED ORDER — ONDANSETRON HCL 4 MG/2ML IJ SOLN
INTRAMUSCULAR | Status: DC | PRN
  Administered 2014-09-19: 4 mg via INTRAVENOUS

## 2014-09-19 MED ORDER — LIDOCAINE HCL (CARDIAC) 20 MG/ML IV SOLN
INTRAVENOUS | Status: DC | PRN
  Administered 2014-09-19: 100 mg via INTRAVENOUS

## 2014-09-19 MED ORDER — MIDAZOLAM HCL 5 MG/5ML IJ SOLN
INTRAMUSCULAR | Status: DC | PRN
  Administered 2014-09-19: 2 mg via INTRAVENOUS

## 2014-09-19 MED ORDER — HYDROMORPHONE HCL 1 MG/ML IJ SOLN
0.2500 mg | INTRAMUSCULAR | Status: DC | PRN
  Administered 2014-09-19 (×3): 0.5 mg via INTRAVENOUS

## 2014-09-19 MED ORDER — BUPIVACAINE HCL (PF) 0.25 % IJ SOLN
INTRAMUSCULAR | Status: AC
  Filled 2014-09-19: qty 30

## 2014-09-19 MED ORDER — CEFAZOLIN SODIUM-DEXTROSE 2-3 GM-% IV SOLR
2.0000 g | INTRAVENOUS | Status: AC
  Administered 2014-09-19: 2 g via INTRAVENOUS

## 2014-09-19 MED ORDER — LIDOCAINE HCL (PF) 1 % IJ SOLN
INTRAMUSCULAR | Status: AC
  Filled 2014-09-19: qty 30

## 2014-09-19 MED ORDER — MIDAZOLAM HCL 2 MG/2ML IJ SOLN
INTRAMUSCULAR | Status: AC
  Filled 2014-09-19: qty 2

## 2014-09-19 MED ORDER — BUPIVACAINE HCL (PF) 0.25 % IJ SOLN
INTRAMUSCULAR | Status: DC | PRN
  Administered 2014-09-19: 7 mL

## 2014-09-19 MED ORDER — HYDROMORPHONE HCL 1 MG/ML IJ SOLN
INTRAMUSCULAR | Status: AC
Start: 2014-09-19 — End: 2014-09-19
  Filled 2014-09-19: qty 1

## 2014-09-19 MED ORDER — OXYCODONE-ACETAMINOPHEN 5-325 MG PO TABS: ORAL_TABLET | ORAL | Status: DC

## 2014-09-19 MED ORDER — FENTANYL CITRATE (PF) 100 MCG/2ML IJ SOLN
INTRAMUSCULAR | Status: DC | PRN
  Administered 2014-09-19: 100 ug via INTRAVENOUS
  Administered 2014-09-19: 25 ug via INTRAVENOUS

## 2014-09-19 MED ORDER — LACTATED RINGERS IV SOLN
INTRAVENOUS | Status: DC
  Administered 2014-09-19 (×2): via INTRAVENOUS

## 2014-09-19 MED ORDER — HYDROMORPHONE HCL 1 MG/ML IJ SOLN
INTRAMUSCULAR | Status: AC
  Filled 2014-09-19: qty 1

## 2014-09-19 MED ORDER — FENTANYL CITRATE (PF) 100 MCG/2ML IJ SOLN
INTRAMUSCULAR | Status: AC
  Filled 2014-09-19: qty 6

## 2014-09-19 MED ORDER — PROPOFOL 10 MG/ML IV BOLUS
INTRAVENOUS | Status: DC | PRN
  Administered 2014-09-19: 150 mg via INTRAVENOUS

## 2014-09-19 SURGICAL SUPPLY — 66 items
BANDAGE ELASTIC 3 VELCRO ST LF (GAUZE/BANDAGES/DRESSINGS) IMPLANT
BIT DRILL 1.1 (BIT) ×1
BIT DRILL 1.1MM (BIT) ×1
BIT DRILL 60X20X1.1XQC TMX (BIT) ×1 IMPLANT
BIT DRL 60X20X1.1XQC TMX (BIT) ×1
BLADE MINI RND TIP GREEN BEAV (BLADE) IMPLANT
BLADE SURG 15 STRL LF DISP TIS (BLADE) ×2 IMPLANT
BLADE SURG 15 STRL SS (BLADE) ×6
BNDG CMPR 9X4 STRL LF SNTH (GAUZE/BANDAGES/DRESSINGS) ×1
BNDG ESMARK 4X9 LF (GAUZE/BANDAGES/DRESSINGS) ×3 IMPLANT
BNDG GAUZE ELAST 4 BULKY (GAUZE/BANDAGES/DRESSINGS) ×3 IMPLANT
CHLORAPREP W/TINT 26ML (MISCELLANEOUS) ×3 IMPLANT
CORDS BIPOLAR (ELECTRODE) ×3 IMPLANT
COVER BACK TABLE 60X90IN (DRAPES) ×3 IMPLANT
COVER MAYO STAND STRL (DRAPES) ×3 IMPLANT
CUFF TOURNIQUET SINGLE 18IN (TOURNIQUET CUFF) ×3 IMPLANT
DRAPE EXTREMITY T 121X128X90 (DRAPE) ×3 IMPLANT
DRAPE OEC MINIVIEW 54X84 (DRAPES) ×3 IMPLANT
DRAPE SURG 17X23 STRL (DRAPES) ×3 IMPLANT
GAUZE SPONGE 4X4 12PLY STRL (GAUZE/BANDAGES/DRESSINGS) ×3 IMPLANT
GAUZE XEROFORM 1X8 LF (GAUZE/BANDAGES/DRESSINGS) ×3 IMPLANT
GLOVE BIO SURGEON STRL SZ 6.5 (GLOVE) ×2 IMPLANT
GLOVE BIO SURGEON STRL SZ7.5 (GLOVE) ×3 IMPLANT
GLOVE BIO SURGEONS STRL SZ 6.5 (GLOVE) ×1
GLOVE BIOGEL M STRL SZ7.5 (GLOVE) ×3 IMPLANT
GLOVE BIOGEL PI IND STRL 7.0 (GLOVE) ×2 IMPLANT
GLOVE BIOGEL PI IND STRL 8 (GLOVE) ×1 IMPLANT
GLOVE BIOGEL PI INDICATOR 7.0 (GLOVE) ×4
GLOVE BIOGEL PI INDICATOR 8 (GLOVE) ×2
GLOVE ECLIPSE 6.5 STRL STRAW (GLOVE) ×3 IMPLANT
GOWN STRL REUS W/ TWL LRG LVL3 (GOWN DISPOSABLE) ×3 IMPLANT
GOWN STRL REUS W/ TWL XL LVL3 (GOWN DISPOSABLE) ×1 IMPLANT
GOWN STRL REUS W/TWL LRG LVL3 (GOWN DISPOSABLE) ×9
GOWN STRL REUS W/TWL XL LVL3 (GOWN DISPOSABLE) ×3
K-WIRE DBL TRONS .035X6 (WIRE) ×6
KWIRE DBL TRONS .035X6 (WIRE) ×2 IMPLANT
NEEDLE HYPO 22GX1.5 SAFETY (NEEDLE) IMPLANT
NEEDLE HYPO 25X1 1.5 SAFETY (NEEDLE) ×3 IMPLANT
NS IRRIG 1000ML POUR BTL (IV SOLUTION) ×3 IMPLANT
PACK BASIN DAY SURGERY FS (CUSTOM PROCEDURE TRAY) ×3 IMPLANT
PAD CAST 3X4 CTTN HI CHSV (CAST SUPPLIES) IMPLANT
PAD CAST 4YDX4 CTTN HI CHSV (CAST SUPPLIES) ×1 IMPLANT
PADDING CAST ABS 4INX4YD NS (CAST SUPPLIES)
PADDING CAST ABS COTTON 4X4 ST (CAST SUPPLIES) IMPLANT
PADDING CAST COTTON 3X4 STRL (CAST SUPPLIES)
PADDING CAST COTTON 4X4 STRL (CAST SUPPLIES) ×3
PLATE STRAIGHT LOCK 1.5 (Plate) ×3 IMPLANT
SCREW NL 1.5X11 WRIST (Screw) ×6 IMPLANT
SCREW NL 1.5X13 (Screw) ×3 IMPLANT
SCREW NONIOC 1.5 10M (Screw) ×12 IMPLANT
SLEEVE SCD COMPRESS KNEE MED (MISCELLANEOUS) ×3 IMPLANT
SPLINT PLASTER CAST XFAST 3X15 (CAST SUPPLIES) IMPLANT
SPLINT PLASTER CAST XFAST 4X15 (CAST SUPPLIES) IMPLANT
SPLINT PLASTER XTRA FAST SET 4 (CAST SUPPLIES)
SPLINT PLASTER XTRA FASTSET 3X (CAST SUPPLIES)
STOCKINETTE 4X48 STRL (DRAPES) ×3 IMPLANT
SUT ETHILON 3 0 PS 1 (SUTURE) IMPLANT
SUT ETHILON 4 0 PS 2 18 (SUTURE) ×3 IMPLANT
SUT MERSILENE 4 0 P 3 (SUTURE) IMPLANT
SUT VIC AB 3-0 PS1 18 (SUTURE) ×3
SUT VIC AB 3-0 PS1 18XBRD (SUTURE) ×1 IMPLANT
SUT VICRYL 4-0 PS2 18IN ABS (SUTURE) ×3 IMPLANT
SYR BULB 3OZ (MISCELLANEOUS) ×3 IMPLANT
SYR CONTROL 10ML LL (SYRINGE) ×3 IMPLANT
TOWEL OR 17X24 6PK STRL BLUE (TOWEL DISPOSABLE) ×6 IMPLANT
UNDERPAD 30X30 (UNDERPADS AND DIAPERS) ×3 IMPLANT

## 2014-09-19 NOTE — Brief Op Note (Signed)
09/19/2014  2:15 PM  PATIENT:  James MorinBradley Richard  24 y.o. male  PRE-OPERATIVE DIAGNOSIS:  RIGHT SMALL METACARPAL FRACTURE  POST-OPERATIVE DIAGNOSIS:  RIGHT SMALL METACARPAL FRACTURE  PROCEDURE:  Procedure(s) with comments: OPEN REDUCTION INTERNAL FIXATION (ORIF) RIGHT SMALL METACARPAL FRACTURE (Right) - right  SURGEON:  Surgeon(s) and Role:    * Betha LoaKevin Karlyn Glasco, MD - Primary  PHYSICIAN ASSISTANT:   ASSISTANTS: Annye Ruskobert Dasnoit, PA   ANESTHESIA:   general  EBL:  Total I/O In: 1200 [I.V.:1200] Out: -   BLOOD ADMINISTERED:none  DRAINS: none   LOCAL MEDICATIONS USED:  MARCAINE     SPECIMEN:  No Specimen  DISPOSITION OF SPECIMEN:  N/A  COUNTS:  YES  TOURNIQUET:   Total Tourniquet Time Documented: Upper Arm (Right) - 47 minutes Total: Upper Arm (Right) - 47 minutes   DICTATION: .Other Dictation: Dictation Number [id number]  PLAN OF CARE: Discharge to home after PACU  PATIENT DISPOSITION:  PACU - hemodynamically stable.
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
## 2014-09-19 NOTE — Discharge Instructions (Addendum)

## 2014-09-19 NOTE — Anesthesia Procedure Notes (Signed)
Procedure Name: LMA Insertion Date/Time: 09/19/2014 1:18 PM Performed by: Caren MacadamARTER, Rayhan Groleau W Pre-anesthesia Checklist: Patient identified, Emergency Drugs available, Suction available and Patient being monitored Patient Re-evaluated:Patient Re-evaluated prior to inductionOxygen Delivery Method: Circle System Utilized Preoxygenation: Pre-oxygenation with 100% oxygen Intubation Type: IV induction Ventilation: Mask ventilation without difficulty LMA: LMA inserted LMA Size: 5.0 Number of attempts: 1 Airway Equipment and Method: Bite block Placement Confirmation: positive ETCO2 and breath sounds checked- equal and bilateral Tube secured with: Tape Dental Injury: Teeth and Oropharynx as per pre-operative assessment

## 2014-09-19 NOTE — Anesthesia Preprocedure Evaluation (Addendum)
Anesthesia Evaluation  Patient identified by MRN, date of birth, ID band Patient awake    Reviewed: Allergy & Precautions, H&P , NPO status , Patient's Chart, lab work & pertinent test results  Airway Mallampati: II  TM Distance: >3 FB Neck ROM: Full    Dental no notable dental hx. (+) Teeth Intact, Dental Advisory Given, Poor Dentition   Pulmonary Current Smoker,  breath sounds clear to auscultation  Pulmonary exam normal       Cardiovascular hypertension, Rhythm:Regular Rate:Normal     Neuro/Psych Depression  Neuromuscular disease    GI/Hepatic Neg liver ROS, GERD-  Controlled,  Endo/Other  negative endocrine ROS  Renal/GU negative Renal ROS  negative genitourinary   Musculoskeletal   Abdominal   Peds  Hematology negative hematology ROS (+)   Anesthesia Other Findings   Reproductive/Obstetrics negative OB ROS                            Anesthesia Physical Anesthesia Plan  ASA: II  Anesthesia Plan: General   Post-op Pain Management:    Induction: Intravenous  Airway Management Planned: LMA  Additional Equipment:   Intra-op Plan:   Post-operative Plan: Extubation in OR  Informed Consent: I have reviewed the patients History and Physical, chart, labs and discussed the procedure including the risks, benefits and alternatives for the proposed anesthesia with the patient or authorized representative who has indicated his/her understanding and acceptance.   Dental advisory given  Plan Discussed with: CRNA  Anesthesia Plan Comments:         Anesthesia Quick Evaluation

## 2014-09-19 NOTE — Transfer of Care (Signed)
Immediate Anesthesia Transfer of Care Note  Patient: James MorinBradley Richard  Procedure(s) Performed: Procedure(s) with comments: OPEN REDUCTION INTERNAL FIXATION (ORIF) RIGHT SMALL METACARPAL FRACTURE (Right) - right  Patient Location: PACU  Anesthesia Type:General  Level of Consciousness: awake, alert  and oriented  Airway & Oxygen Therapy: Patient Spontanous Breathing and Patient connected to face mask oxygen  Post-op Assessment: Report given to RN and Post -op Vital signs reviewed and stable  Post vital signs: Reviewed and stable  Last Vitals:  Filed Vitals:   09/19/14 1422  BP:   Pulse: 97  Temp:   Resp: 12    Complications: No apparent anesthesia complications

## 2014-09-19 NOTE — Anesthesia Postprocedure Evaluation (Signed)
  Anesthesia Post-op Note  Patient: James MorinBradley Richard  Procedure(s) Performed: Procedure(s) with comments: OPEN REDUCTION INTERNAL FIXATION (ORIF) RIGHT SMALL METACARPAL FRACTURE (Right) - right  Patient Location: PACU  Anesthesia Type:General  Level of Consciousness: awake and alert   Airway and Oxygen Therapy: Patient Spontanous Breathing  Post-op Pain: moderate  Post-op Assessment: Post-op Vital signs reviewed, Patient's Cardiovascular Status Stable and Respiratory Function Stable  Post-op Vital Signs: Reviewed  Filed Vitals:   09/19/14 1515  BP: 137/90  Pulse: 81  Temp:   Resp: 14    Complications: No apparent anesthesia complications

## 2014-09-19 NOTE — Op Note (Signed)
[id number] 

## 2014-09-20 ENCOUNTER — Encounter (HOSPITAL_BASED_OUTPATIENT_CLINIC_OR_DEPARTMENT_OTHER): Payer: Self-pay | Admitting: Orthopedic Surgery
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
## 2014-09-24 NOTE — Op Note (Signed)
Intra-operative fluoroscopic images in the AP, lateral, and oblique views were taken and evaluated by myself.  Reduction and hardware placement were confirmed.  There was no intraarticular penetration of permanent hardware.  

## 2014-09-25 ENCOUNTER — Ambulatory Visit: Payer: Medicaid Other | Attending: Orthopedic Surgery | Admitting: Occupational Therapy

## 2014-09-25 DIAGNOSIS — M6289 Other specified disorders of muscle: Secondary | ICD-10-CM | POA: Insufficient documentation

## 2014-09-25 DIAGNOSIS — M25641 Stiffness of right hand, not elsewhere classified: Secondary | ICD-10-CM | POA: Insufficient documentation

## 2014-09-26 ENCOUNTER — Encounter: Payer: Self-pay | Admitting: Occupational Therapy

## 2014-09-26 ENCOUNTER — Ambulatory Visit: Payer: Medicaid Other | Admitting: Occupational Therapy

## 2014-09-26 DIAGNOSIS — M25641 Stiffness of right hand, not elsewhere classified: Secondary | ICD-10-CM | POA: Diagnosis not present

## 2014-09-26 DIAGNOSIS — R29898 Other symptoms and signs involving the musculoskeletal system: Secondary | ICD-10-CM

## 2014-09-26 DIAGNOSIS — M6289 Other specified disorders of muscle: Secondary | ICD-10-CM | POA: Diagnosis not present

## 2014-09-26 NOTE — Patient Instructions (Signed)
WEARING SCHEDULE:  Wear splint at ALL times except for hygiene care (1-2x/day)  PURPOSE:  To prevent movement and for protection until injury can heal  CARE OF SPLINT:  Keep splint away from heat sources including: stove, radiator or furnace, or a car in sunlight. The splint can melt and will no longer fit you properly  Keep away from pets and children  Clean the splint with rubbing alcohol 1-2 times per day.  * During this time, make sure you also clean your hand/arm as instructed by your therapist and/or perform dressing changes as needed. Then dry hand/arm completely before replacing splint. (When cleaning hand/arm, keep it immobilized in same position until splint is replaced)  PRECAUTIONS/POTENTIAL PROBLEMS: *If you notice or experience increased pain, swelling, numbness, or a lingering reddened area from the splint: Contact your therapist immediately by calling [phone]. You must wear the splint for protection, but we will get you scheduled for adjustments as quickly as possible.  (If only straps or hooks need to be replaced and NO adjustments to the splint need to be made, just call the office ahead and let them know you are coming in)  If you have any medical concerns or signs of infection, please call your doctor immediately
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
## 2014-10-02 ENCOUNTER — Ambulatory Visit: Payer: Medicaid Other | Admitting: Occupational Therapy

## 2014-10-15 ENCOUNTER — Encounter: Payer: Self-pay | Admitting: Occupational Therapy

## 2014-10-29 ENCOUNTER — Ambulatory Visit: Payer: Medicaid Other | Admitting: Occupational Therapy

## 2014-11-12 ENCOUNTER — Encounter: Payer: Self-pay | Admitting: Occupational Therapy

## 2015-02-13 ENCOUNTER — Telehealth: Payer: Self-pay | Admitting: Family Medicine

## 2015-02-13 NOTE — Telephone Encounter (Signed)
Will forward to PCP for review of new order for new pair of crutches. Please forward response back to [facility].  Analuisa Tudor, CMA.

## 2015-02-13 NOTE — Telephone Encounter (Signed)
PCLM for pt to call.  If patient or the grandmother calls, please have them make an appt to meet with Dr. Jarvis NewcomerGrunz. Sunday SpillersSharon T Sharlyn Odonnel, CMA

## 2015-02-13 NOTE — Telephone Encounter (Signed)
Pt grandmother calling to ask for an order for a new pair of crutches for the patient. She states his old pair has been broken and he needs them to get around. I asked where these orders need to be sent and she stated that it didn't matter. Thank you, Dorothey BasemanSadie Reynolds, ASA

## 2015-02-13 NOTE — Telephone Encounter (Signed)
appt scheduled for 10/25

## 2015-02-13 NOTE — Telephone Encounter (Signed)
I am happy to do this, though I've never met this patient and he was last seen in June 2015 so he needs to have an appointment. I will write the Rx at that time unless it is emergent. Thanks! 

## 2015-02-22 ENCOUNTER — Emergency Department (HOSPITAL_COMMUNITY)
Admission: EM | Admit: 2015-02-22 | Discharge: 2015-02-22 | Discharge status: Against Medical Advice | Payer: Medicaid Other | Attending: Emergency Medicine | Admitting: Emergency Medicine

## 2015-02-22 DIAGNOSIS — I10 Essential (primary) hypertension: Secondary | ICD-10-CM | POA: Insufficient documentation

## 2015-02-22 DIAGNOSIS — Z720 Tobacco use: Secondary | ICD-10-CM | POA: Diagnosis not present

## 2015-02-22 DIAGNOSIS — R109 Unspecified abdominal pain: Secondary | ICD-10-CM | POA: Insufficient documentation

## 2015-02-22 LAB — COMPREHENSIVE METABOLIC PANEL
ALBUMIN: 4.3 g/dL (ref 3.5–5.0)
ALK PHOS: 67 U/L (ref 38–126)
ALT: 16 U/L — AB (ref 17–63)
AST: 18 U/L (ref 15–41)
Anion gap: 6 (ref 5–15)
BUN: 14 mg/dL (ref 6–20)
CO2: 30 mmol/L (ref 22–32)
CREATININE: 1.06 mg/dL (ref 0.61–1.24)
Calcium: 9.3 mg/dL (ref 8.9–10.3)
Chloride: 104 mmol/L (ref 101–111)
GFR calc Af Amer: >60 mL/min (ref >60)
GFR calc non Af Amer: >60 mL/min (ref >60)
GLUCOSE: 112 mg/dL — AB (ref 65–99)
Potassium: 3.8 mmol/L (ref 3.5–5.1)
SODIUM: 140 mmol/L (ref 135–145)
Total Bilirubin: 0.7 mg/dL (ref 0.3–1.2)
Total Protein: 7.2 g/dL (ref 6.5–8.1)

## 2015-02-22 LAB — CBC
HCT: 40.6 % (ref 39.0–52.0)
Hemoglobin: 14 g/dL (ref 13.0–17.0)
MCH: 31.2 pg (ref 26.0–34.0)
MCHC: 34.5 g/dL (ref 30.0–36.0)
MCV: 90.4 fL (ref 78.0–100.0)
PLATELETS: 174 10*3/uL (ref 150–400)
RBC: 4.49 MIL/uL (ref 4.22–5.81)
RDW: 12.6 % (ref 11.5–15.5)
WBC: 11.4 10*3/uL — ABNORMAL HIGH (ref 4.0–10.5)

## 2015-02-22 LAB — LIPASE, BLOOD: Lipase: 27 U/L (ref 11–51)

## 2015-02-22 NOTE — ED Notes (Signed)
Patient called for triage. Not in lobby.

## 2015-02-22 NOTE — ED Notes (Signed)
Pt called in lobby x3, no answer °

## 2015-02-22 NOTE — ED Notes (Signed)
Pt c/o medial mid-upper abdominal pain with nausea onset Tuesday. Denies emesis or diarrhea.

## 2015-02-23 ENCOUNTER — Encounter (HOSPITAL_COMMUNITY): Payer: Self-pay | Admitting: Emergency Medicine

## 2015-02-23 ENCOUNTER — Emergency Department (HOSPITAL_COMMUNITY)
Admission: EM | Admit: 2015-02-23 | Discharge: 2015-02-23 | Disposition: A | Discharge status: Home / Self Care | Payer: Medicaid Other | Attending: Emergency Medicine | Admitting: Emergency Medicine

## 2015-02-23 ENCOUNTER — Emergency Department (HOSPITAL_COMMUNITY): Payer: Medicaid Other

## 2015-02-23 DIAGNOSIS — K219 Gastro-esophageal reflux disease without esophagitis: Secondary | ICD-10-CM | POA: Diagnosis not present

## 2015-02-23 DIAGNOSIS — Z79899 Other long term (current) drug therapy: Secondary | ICD-10-CM | POA: Insufficient documentation

## 2015-02-23 DIAGNOSIS — R1013 Epigastric pain: Secondary | ICD-10-CM

## 2015-02-23 DIAGNOSIS — R112 Nausea with vomiting, unspecified: Secondary | ICD-10-CM | POA: Diagnosis not present

## 2015-02-23 DIAGNOSIS — R109 Unspecified abdominal pain: Secondary | ICD-10-CM | POA: Diagnosis present

## 2015-02-23 DIAGNOSIS — I10 Essential (primary) hypertension: Secondary | ICD-10-CM | POA: Diagnosis not present

## 2015-02-23 DIAGNOSIS — Z720 Tobacco use: Secondary | ICD-10-CM | POA: Diagnosis not present

## 2015-02-23 DIAGNOSIS — Z8659 Personal history of other mental and behavioral disorders: Secondary | ICD-10-CM | POA: Diagnosis not present

## 2015-02-23 DIAGNOSIS — N390 Urinary tract infection, site not specified: Secondary | ICD-10-CM

## 2015-02-23 LAB — URINE MICROSCOPIC-ADD ON

## 2015-02-23 LAB — COMPREHENSIVE METABOLIC PANEL
ALT: 16 U/L — AB (ref 17–63)
AST: 18 U/L (ref 15–41)
Albumin: 4 g/dL (ref 3.5–5.0)
Alkaline Phosphatase: 69 U/L (ref 38–126)
Anion gap: 10 (ref 5–15)
BUN: 17 mg/dL (ref 6–20)
CHLORIDE: 100 mmol/L — AB (ref 101–111)
CO2: 28 mmol/L (ref 22–32)
Calcium: 9.8 mg/dL (ref 8.9–10.3)
Creatinine, Ser: 1.12 mg/dL (ref 0.61–1.24)
GFR calc Af Amer: >60 mL/min (ref >60)
GLUCOSE: 107 mg/dL — AB (ref 65–99)
POTASSIUM: 4 mmol/L (ref 3.5–5.1)
SODIUM: 138 mmol/L (ref 135–145)
Total Bilirubin: 0.7 mg/dL (ref 0.3–1.2)
Total Protein: 6.7 g/dL (ref 6.5–8.1)

## 2015-02-23 LAB — URINALYSIS, ROUTINE W REFLEX MICROSCOPIC
BILIRUBIN URINE: NEGATIVE
Glucose, UA: NEGATIVE mg/dL
Hgb urine dipstick: NEGATIVE
Ketones, ur: 15 mg/dL — AB
NITRITE: POSITIVE — AB
PH: 7.5 (ref 5.0–8.0)
Protein, ur: NEGATIVE mg/dL
Specific Gravity, Urine: 1.022 (ref 1.005–1.030)
Urobilinogen, UA: 1 mg/dL (ref 0.0–1.0)

## 2015-02-23 LAB — CBC
HEMATOCRIT: 41.4 % (ref 39.0–52.0)
Hemoglobin: 13.9 g/dL (ref 13.0–17.0)
MCH: 30.2 pg (ref 26.0–34.0)
MCHC: 33.6 g/dL (ref 30.0–36.0)
MCV: 90 fL (ref 78.0–100.0)
Platelets: 186 10*3/uL (ref 150–400)
RBC: 4.6 MIL/uL (ref 4.22–5.81)
RDW: 12.8 % (ref 11.5–15.5)
WBC: 11.8 10*3/uL — AB (ref 4.0–10.5)

## 2015-02-23 LAB — LIPASE, BLOOD: Lipase: 26 U/L (ref 11–51)

## 2015-02-23 MED ORDER — IOHEXOL 300 MG/ML  SOLN
100.0000 mL | Freq: Once | INTRAMUSCULAR | Status: AC | PRN
  Administered 2015-02-23: 100 mL via INTRAVENOUS

## 2015-02-23 MED ORDER — SODIUM CHLORIDE 0.9 % IV SOLN
INTRAVENOUS | Status: DC
  Administered 2015-02-23: 16:00:00 via INTRAVENOUS

## 2015-02-23 MED ORDER — FAMOTIDINE 20 MG PO TABS: 20.0000 mg | ORAL_TABLET | Freq: Two times a day (BID) | ORAL | Status: DC

## 2015-02-23 MED ORDER — PROMETHAZINE HCL 25 MG PO TABS: 25.0000 mg | ORAL_TABLET | Freq: Four times a day (QID) | ORAL | Status: DC | PRN

## 2015-02-23 MED ORDER — SODIUM CHLORIDE 0.9 % IV BOLUS (SEPSIS)
1000.0000 mL | Freq: Once | INTRAVENOUS | Status: AC
  Administered 2015-02-23: 1000 mL via INTRAVENOUS

## 2015-02-23 MED ORDER — FENTANYL CITRATE (PF) 100 MCG/2ML IJ SOLN: 50.0000 ug | Freq: Once | INTRAMUSCULAR | Status: DC

## 2015-02-23 MED ORDER — DEXTROSE 5 % IV SOLN
1.0000 g | Freq: Once | INTRAVENOUS | Status: AC
  Administered 2015-02-23: 1 g via INTRAVENOUS
  Filled 2015-02-23: qty 10

## 2015-02-23 MED ORDER — DOXYCYCLINE HYCLATE 100 MG PO CAPS: 100.0000 mg | ORAL_CAPSULE | Freq: Two times a day (BID) | ORAL | Status: DC

## 2015-02-23 MED ORDER — ONDANSETRON HCL 4 MG/2ML IJ SOLN
4.0000 mg | Freq: Once | INTRAMUSCULAR | Status: AC
  Administered 2015-02-23: 4 mg via INTRAVENOUS
  Filled 2015-02-23: qty 2

## 2015-02-23 MED ORDER — PANTOPRAZOLE SODIUM 40 MG IV SOLR
40.0000 mg | Freq: Once | INTRAVENOUS | Status: AC
  Administered 2015-02-23: 40 mg via INTRAVENOUS
  Filled 2015-02-23: qty 40

## 2015-02-23 NOTE — ED Notes (Signed)
Patient resting in room before CT
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
## 2015-02-23 NOTE — Discharge Instructions (Signed)
Make an appointment to follow-up with your primary care doctor in the next week or so. CT scan raise concerns possibly for a gastric ulcer. Take the Pepcid as directed. Take Phenergan as needed for nausea. Take the antibiotic as directed for the next 10 days. This is for either an infection in your prostate or urinary tract infection. Return for any new or worse symptoms.

## 2015-02-23 NOTE — ED Notes (Signed)
Pt. Stated, I've been having rerally bad stomach pains and every time I eat I throw it back up.  Im feeling really tired.

## 2015-02-25 ENCOUNTER — Ambulatory Visit: Payer: Self-pay | Admitting: Family Medicine

## 2015-02-26 LAB — URINE CULTURE

## 2015-03-03 ENCOUNTER — Ambulatory Visit: Payer: Self-pay | Admitting: Family Medicine

## 2015-03-31 ENCOUNTER — Ambulatory Visit (INDEPENDENT_AMBULATORY_CARE_PROVIDER_SITE_OTHER): Payer: Medicaid Other | Admitting: Family Medicine

## 2015-03-31 ENCOUNTER — Encounter: Payer: Self-pay | Admitting: Family Medicine

## 2015-03-31 VITALS — BP 117/66 | HR 76 | Temp 98.2°F | Ht 66.0 in

## 2015-03-31 DIAGNOSIS — G8220 Paraplegia, unspecified: Secondary | ICD-10-CM

## 2015-03-31 DIAGNOSIS — R109 Unspecified abdominal pain: Secondary | ICD-10-CM
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
## 2015-03-31 NOTE — Patient Instructions (Signed)
Thank you for coming in,   Please let me know if the prescription is written incorrectly and I can send another one in.   Please let me know if you need one written for ankles braces.    Sign up for My Chart to have easy access to your labs results, and communication with your Primary care physician   Please feel free to call with any questions or concerns at any time, at [phone]. --Dr. Jordan LikesSchmitz,.

## 2015-04-01 DIAGNOSIS — R109 Unspecified abdominal pain: Secondary | ICD-10-CM | POA: Insufficient documentation

## 2015-04-01 NOTE — Assessment & Plan Note (Signed)
Is following up from the ED. Has resolved - He will follow-up if symptoms present again.

## 2015-04-01 NOTE — Assessment & Plan Note (Signed)
I've written a prescription for forearm crutches since his old ones have deteriorated.

## 2015-06-03 ENCOUNTER — Encounter: Payer: Self-pay | Admitting: Family Medicine

## 2015-06-03 ENCOUNTER — Ambulatory Visit (INDEPENDENT_AMBULATORY_CARE_PROVIDER_SITE_OTHER): Payer: Medicaid Other | Admitting: Family Medicine

## 2015-06-03 VITALS — BP 140/71 | HR 97 | Temp 98.4°F

## 2015-06-03 DIAGNOSIS — H0012 Chalazion right lower eyelid: Secondary | ICD-10-CM | POA: Diagnosis not present

## 2015-06-03 NOTE — Assessment & Plan Note (Signed)
Patient is here with complaints of lower lid inflammation and erythema of the right eye. Signs and symptoms most consistent with chalazion. No history of trauma. No conjunctival or corneal involvement. Denies any vision changes, fever, chills, headache, nausea, or vomiting. - Advised regular warm compresses. 4-6 times a day. - I asked patient to follow-up in our office in 3-5 days if patient's symptoms worsen or persist. At that time consideration for ophthalmology consult may be warranted. - Asked patient to follow-up immediately if he has any evidence of eye pain or vision changes.

## 2015-06-03 NOTE — Progress Notes (Signed)
EYE COMPLAINT Started yesterday. Rt lower eye lid swelling. Significantly worse today. No significant pain. No drainage. No vision changes. No trauma.   Eye symptoms started 2 days ago. Eye involved: Right Eye symptom progression: Lower lid swelling as been worsening Other people with same problem: no Medications tried:  no Eye Trauma: no Contact Lens: no Recent eye surgeries: no  Symptoms Itching: yes, on the medial aspect of the lid Eye discharge or mattering: no Vision impairment: no Photophobia: no Nose discharge: no Sneezing: no Vomiting: no Rings around lights:  no  ROS see HPI Smoking Status noted  Objective: BP 140/71 mmHg  Pulse 97  Temp(Src) 98.4 F (36.9 C) (Oral) Gen: NAD, alert, cooperative, and pleasant. HEENT: NCAT, EOMI, PERRL, right lower lid edema and erythema, no discharge/crusting, conjunctiva normal appearing w/o injection, no evidence of purulent inflammation noted  Assessment and plan:  Chalazion of right lower eyelid Patient is here with complaints of lower lid inflammation and erythema of the right eye. Signs and symptoms most consistent with chalazion. No history of trauma. No conjunctival or corneal involvement. Denies any vision changes, fever, chills, headache, nausea, or vomiting. - Advised regular warm compresses. 4-6 times a day. - I asked patient to follow-up in our office in 3-5 days if patient's symptoms worsen or persist. At that time consideration for ophthalmology consult may be warranted. - Asked patient to follow-up immediately if he has any evidence of eye pain or vision changes.   Kathee Delton, MD,MS,  PGY2 06/03/2015 2:56 PM

## 2015-06-03 NOTE — Patient Instructions (Signed)
Chalazion A chalazion is a swelling or lump on the eyelid. It can affect the upper or lower eyelid. CAUSES This condition may be caused by:  Long-lasting (chronic) inflammation of the eyelid glands.  A blocked oil gland in the eyelid. SYMPTOMS Symptoms of this condition include:  A swelling on the eyelid. The swelling may spread to areas around the eye.  A hard lump on the eyelid. This lump may make it hard to see out of the eye. DIAGNOSIS This condition is diagnosed with an examination of the eye. TREATMENT This condition is treated by applying a warm compress to the eyelid. If the condition does not improve after two days, it may be treated with:  Surgery.  Medicine that is injected into the chalazion by a health care provider.  Medicine that is applied to the eye. HOME CARE INSTRUCTIONS  Do not touch the chalazion.  Do not try to remove the pus, such as by squeezing the chalazion or sticking it with a pin or needle.  Do not rub your eyes.  Wash your hands often. Dry your hands with a clean towel.  Keep your face, scalp, and eyebrows clean.  Avoid wearing eye makeup.  Apply a warm, moist compress to the eyelid 4-6 times a day for 10-15 minutes at a time. This will help to open any blocked glands and help to reduce redness and swelling.  Apply over-the-counter and prescription medicines only as told by your health care provider.  If the chalazion does not break open (rupture) on its own in a month, return to your health care provider.  Keep all follow-up appointments as told by your health care provider. This is important. SEEK MEDICAL CARE IF:  Your eyelid has not improved in 4 weeks.  Your eyelid is getting worse.  You have a fever.  The chalazion does not rupture on its own with home treatment in a month. SEEK IMMEDIATE MEDICAL CARE IF:  You have pain in your eye.  Your vision changes.  The chalazion becomes painful or red  The chalazion gets  bigger.   This information is not intended to replace advice given to you by your health care provider. Make sure you discuss any questions you have with your health care provider.   Document Released: 04/16/2000 Document Revised: 01/08/2015 Document Reviewed: 08/12/2014 Elsevier Interactive Patient Education 2016 [employer]  

## 2015-08-16 ENCOUNTER — Encounter (HOSPITAL_COMMUNITY): Payer: Self-pay | Admitting: Emergency Medicine

## 2015-08-16 ENCOUNTER — Emergency Department (HOSPITAL_COMMUNITY)
Admission: EM | Admit: 2015-08-16 | Discharge: 2015-08-16 | Disposition: A | Discharge status: Home / Self Care | Payer: Medicaid Other | Attending: Emergency Medicine | Admitting: Emergency Medicine

## 2015-08-16 ENCOUNTER — Emergency Department (HOSPITAL_COMMUNITY): Payer: Medicaid Other

## 2015-08-16 DIAGNOSIS — S6991XA Unspecified injury of right wrist, hand and finger(s), initial encounter: Secondary | ICD-10-CM | POA: Diagnosis present

## 2015-08-16 DIAGNOSIS — Y9289 Other specified places as the place of occurrence of the external cause: Secondary | ICD-10-CM | POA: Insufficient documentation

## 2015-08-16 DIAGNOSIS — T8489XA Other specified complication of internal orthopedic prosthetic devices, implants and grafts, initial encounter: Secondary | ICD-10-CM | POA: Diagnosis not present

## 2015-08-16 DIAGNOSIS — Z8659 Personal history of other mental and behavioral disorders: Secondary | ICD-10-CM | POA: Insufficient documentation

## 2015-08-16 DIAGNOSIS — S62306A Unspecified fracture of fifth metacarpal bone, right hand, initial encounter for closed fracture: Secondary | ICD-10-CM | POA: Diagnosis not present

## 2015-08-16 DIAGNOSIS — Z8719 Personal history of other diseases of the digestive system: Secondary | ICD-10-CM | POA: Diagnosis not present

## 2015-08-16 DIAGNOSIS — F17210 Nicotine dependence, cigarettes, uncomplicated: Secondary | ICD-10-CM | POA: Diagnosis not present

## 2015-08-16 DIAGNOSIS — S62318A Displaced fracture of base of other metacarpal bone, initial encounter for closed fracture: Secondary | ICD-10-CM

## 2015-08-16 DIAGNOSIS — I10 Essential (primary) hypertension: Secondary | ICD-10-CM | POA: Diagnosis not present

## 2015-08-16 DIAGNOSIS — Z79899 Other long term (current) drug therapy: Secondary | ICD-10-CM | POA: Diagnosis not present

## 2015-08-16 DIAGNOSIS — Y9389 Activity, other specified: Secondary | ICD-10-CM | POA: Insufficient documentation

## 2015-08-16 DIAGNOSIS — Y998 Other external cause status: Secondary | ICD-10-CM | POA: Diagnosis not present

## 2015-08-16 DIAGNOSIS — Y658 Other specified misadventures during surgical and medical care: Secondary | ICD-10-CM | POA: Insufficient documentation

## 2015-08-16 MED ORDER — NAPROXEN 500 MG PO TABS: 500.0000 mg | ORAL_TABLET | Freq: Two times a day (BID) | ORAL | Status: DC
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
## 2015-08-16 NOTE — Discharge Instructions (Signed)
Your xray shows that you have a new fracture to your hand as well as evidence of a break to your hardware that was used to pin this bone in your hand. Keep your splint on at all times. Take naproxen for pain control. Elevate your hand and apply ice 3-4 times per day. Follow up with Dr. Merlyn LotKuzma for further management.  Metacarpal Fracture A metacarpal fracture is a break (fracture) of a bone in the hand. Metacarpals are the bones that extend from your knuckles to your wrist. In each hand, you have five metacarpal bones that connect your fingers and your thumb to your wrist. Some hand fractures have bone pieces that are close together and stable (simple). These fractures may be treated with only a splint or cast. Hand fractures that have many pieces of broken bone (comminuted), unstable bone pieces (displaced), or a bone that breaks through the skin (compound) usually require surgery. CAUSES This injury may be caused by:  A fall.  A hard, direct hit to your hand.  An injury that squeezes your knuckle, stretches your finger out of place, or crushes your hand. RISK FACTORS This injury is more likely to occur if:  You play contact sports.  You have certain bone diseases. SYMPTOMS  Symptoms of this type of fracture develop soon after the injury. Symptoms may include:  Swelling.  Pain.  Stiffness.  Increased pain with movement.  Bruising.  Inability to move a finger.  A shortened finger.  A finger knuckle that looks sunken in.  Unusual appearance of the hand or finger (deformity). DIAGNOSIS  This injury may be diagnosed based on your signs and symptoms, especially if you had a recent hand injury. Your health care provider will perform a physical exam. He or she may also order X-rays to confirm the diagnosis.  TREATMENT  Treatment for this injury depends on the type of fracture you have and how severe it is. Possible treatments include:  Non-reduction. This can be done if the bone  does not need to be moved back into place. The fracture can be casted or splinted as it is.   Closed reduction. If your bone is stable and can be moved back into place, you may only need to wear a cast or splint or have buddy taping.  Closed reduction with internal fixation (CRIF). This is the most common treatment. You may have this procedure if your bone can be moved back into place but needs more support. Wires, pins, or screws may be inserted through your skin to stabilize the fracture.  Open reduction with internal fixation (ORIF). This may be needed if your fracture is severe and unstable. It involves surgery to move your bone back into the right position. Screws, wires, or plates are used to stabilize the fracture. After all procedures, you may need to wear a cast or a splint for several weeks. You will also need to have follow-up X-rays to make sure that the bone is healing well and staying in position. After you no longer need your cast or splint, you may need physical therapy. This will help you to regain full movement and strength in your hand.  HOME CARE INSTRUCTIONS  If You Have a Cast:  Do not stick anything inside the cast to scratch your skin. Doing that increases your risk of infection.  Check the skin around the cast every day. Report any concerns to your health care provider. You may put lotion on dry skin around the edges of the  cast. Do not apply lotion to the skin underneath the cast. If You Have a Splint:  Wear it as directed by your health care provider. Remove it only as directed by your health care provider.  Loosen the splint if your fingers become numb and tingle, or if they turn cold and blue. Bathing  Cover the cast or splint with a watertight plastic bag to protect it from water while you take a bath or a shower. Do not let the cast or splint get wet. Managing Pain, Stiffness, and Swelling  If directed, apply ice to the injured area (if you have a splint, not a  cast):  Put ice in a plastic bag.  Place a towel between your skin and the bag.  Leave the ice on for 20 minutes, 2-3 times a day.  Move your fingers often to avoid stiffness and to lessen swelling.  Raise the injured area above the level of your heart while you are sitting or lying down. Driving  Do not drive or operate heavy machinery while taking pain medicine.  Do not drive while wearing a cast or splint on a hand that you use for driving. Activity  Return to your normal activities as directed by your health care provider. Ask your health care provider what activities are safe for you. General Instructions  Do not put pressure on any part of the cast or splint until it is fully hardened. This may take several hours.  Keep the cast or splint clean and dry.  Do not use any tobacco products, including cigarettes, chewing tobacco, or electronic cigarettes. Tobacco can delay bone healing. If you need help quitting, ask your health care provider.  Take medicines only as directed by your health care provider.  Keep all follow-up visits as directed by your health care provider. This is important. SEEK MEDICAL CARE IF:   Your pain is getting worse.  You have redness, swelling, or pain in the injured area.   You have fluid, blood, or pus coming from under your cast or splint.   You notice a bad smell coming from under your cast or splint.   You have a fever.  SEEK IMMEDIATE MEDICAL CARE IF:   You develop a rash.   You have trouble breathing.   Your skin or nails on your injured hand turn blue or gray even after you loosen your splint.  Your injured hand feels cold or becomes numb even after you loosen your splint.   You develop severe pain under the cast or in your hand.   This information is not intended to replace advice given to you by your health care provider. Make sure you discuss any questions you have with your health care provider.   Document Released:  04/19/2005 Document Revised: 01/08/2015 Document Reviewed: 02/06/2014 Elsevier Interactive Patient Education [employer]

## 2015-08-16 NOTE — ED Notes (Signed)
Ortho tech called for splint.

## 2015-08-26 ENCOUNTER — Ambulatory Visit (HOSPITAL_BASED_OUTPATIENT_CLINIC_OR_DEPARTMENT_OTHER): Payer: Medicaid Other | Admitting: Physical Medicine & Rehabilitation

## 2015-08-26 ENCOUNTER — Encounter: Payer: Medicaid Other | Attending: Physical Medicine & Rehabilitation

## 2015-08-26 ENCOUNTER — Encounter: Payer: Self-pay | Admitting: Physical Medicine & Rehabilitation

## 2015-08-26 VITALS — BP 110/67 | HR 87

## 2015-08-26 DIAGNOSIS — K2100 Gastro-esophageal reflux disease with esophagitis, without bleeding: Secondary | ICD-10-CM

## 2015-08-26 DIAGNOSIS — G8220 Paraplegia, unspecified: Secondary | ICD-10-CM

## 2015-08-26 DIAGNOSIS — G9581 Conus medullaris syndrome: Secondary | ICD-10-CM | POA: Insufficient documentation

## 2015-08-26 DIAGNOSIS — IMO0002 Reserved for concepts with insufficient information to code with codable children: Secondary | ICD-10-CM

## 2015-08-26 DIAGNOSIS — F17210 Nicotine dependence, cigarettes, uncomplicated: Secondary | ICD-10-CM | POA: Diagnosis not present

## 2015-08-26 DIAGNOSIS — G7089 Other specified myoneural disorders: Secondary | ICD-10-CM | POA: Insufficient documentation

## 2015-08-26 DIAGNOSIS — K219 Gastro-esophageal reflux disease without esophagitis: Secondary | ICD-10-CM | POA: Diagnosis not present

## 2015-08-26 DIAGNOSIS — F329 Major depressive disorder, single episode, unspecified: Secondary | ICD-10-CM | POA: Insufficient documentation

## 2015-08-26 DIAGNOSIS — I10 Essential (primary) hypertension: Secondary | ICD-10-CM | POA: Diagnosis not present

## 2015-08-26 MED ORDER — BACLOFEN 10 MG PO TABS: 10.0000 mg | ORAL_TABLET | Freq: Every day | ORAL | Status: DC
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
## 2015-08-28 ENCOUNTER — Encounter: Payer: Self-pay | Admitting: Gastroenterology

## 2015-10-09 ENCOUNTER — Telehealth: Payer: Self-pay

## 2015-10-09 NOTE — Telephone Encounter (Signed)
Ok to [employer] B[facility] AFOs from [facility] or [facility]

## 2015-10-09 NOTE — Telephone Encounter (Signed)
Pt's mother called for an rx for his AFO's. She states that they need to be replaced. I verified with her that insurance will cover, and she said yes. Please advise on AFO rx.

## 2015-10-15 NOTE — Telephone Encounter (Signed)
Patients grandmother is calling back about prescription for AFO's.  Patient is needing these ASAP.  Please call at [phone].

## 2015-10-15 NOTE — Telephone Encounter (Signed)
Please arrange this for Mr. James Richard. Please and thank you.

## 2015-10-28 ENCOUNTER — Ambulatory Visit: Payer: Self-pay | Admitting: Gastroenterology

## 2016-01-02 ENCOUNTER — Ambulatory Visit: Payer: Self-pay | Admitting: Gastroenterology

## 2016-02-24 ENCOUNTER — Encounter: Payer: Medicaid Other | Attending: Physical Medicine & Rehabilitation

## 2016-02-24 ENCOUNTER — Ambulatory Visit: Payer: Self-pay | Admitting: Physical Medicine & Rehabilitation

## 2016-05-17 ENCOUNTER — Other Ambulatory Visit: Payer: Self-pay | Admitting: Physical Medicine & Rehabilitation

## 2016-05-17 NOTE — Telephone Encounter (Signed)
Patient requesting a refill for baclofen, last seen at this clinic in April 2017, no-showed last appointment in October 2017 with no future appointments showing, please advise

## 2016-06-07 ENCOUNTER — Encounter: Payer: Self-pay | Admitting: Gastroenterology

## 2016-06-08 ENCOUNTER — Encounter (HOSPITAL_COMMUNITY): Payer: Self-pay | Admitting: Emergency Medicine

## 2016-06-08 ENCOUNTER — Emergency Department (HOSPITAL_COMMUNITY)
Admission: EM | Admit: 2016-06-08 | Discharge: 2016-06-10 | Disposition: A | Discharge status: Home / Self Care | Payer: Medicaid Other | Attending: Emergency Medicine | Admitting: Emergency Medicine

## 2016-06-08 ENCOUNTER — Other Ambulatory Visit: Payer: Self-pay | Admitting: Physical Medicine & Rehabilitation

## 2016-06-08 DIAGNOSIS — Z79899 Other long term (current) drug therapy: Secondary | ICD-10-CM | POA: Insufficient documentation

## 2016-06-08 DIAGNOSIS — F17210 Nicotine dependence, cigarettes, uncomplicated: Secondary | ICD-10-CM | POA: Insufficient documentation

## 2016-06-08 DIAGNOSIS — F1010 Alcohol abuse, uncomplicated: Secondary | ICD-10-CM | POA: Diagnosis not present

## 2016-06-08 DIAGNOSIS — I10 Essential (primary) hypertension: Secondary | ICD-10-CM | POA: Insufficient documentation

## 2016-06-08 DIAGNOSIS — F332 Major depressive disorder, recurrent severe without psychotic features: Secondary | ICD-10-CM | POA: Diagnosis not present

## 2016-06-08 DIAGNOSIS — F1410 Cocaine abuse, uncomplicated: Secondary | ICD-10-CM | POA: Diagnosis present

## 2016-06-08 DIAGNOSIS — F1090 Alcohol use, unspecified, uncomplicated: Secondary | ICD-10-CM | POA: Diagnosis present

## 2016-06-08 DIAGNOSIS — R45851 Suicidal ideations: Secondary | ICD-10-CM

## 2016-06-08 DIAGNOSIS — Z9889 Other specified postprocedural states: Secondary | ICD-10-CM | POA: Diagnosis not present

## 2016-06-08 DIAGNOSIS — F1020 Alcohol dependence, uncomplicated: Secondary | ICD-10-CM

## 2016-06-08 LAB — COMPREHENSIVE METABOLIC PANEL
ALBUMIN: 4.8 g/dL (ref 3.5–5.0)
ALK PHOS: 83 U/L (ref 38–126)
ALT: 31 U/L (ref 17–63)
AST: 23 U/L (ref 15–41)
Anion gap: 11 (ref 5–15)
BILIRUBIN TOTAL: 0.7 mg/dL (ref 0.3–1.2)
BUN: 12 mg/dL (ref 6–20)
CO2: 26 mmol/L (ref 22–32)
Calcium: 9.3 mg/dL (ref 8.9–10.3)
Chloride: 103 mmol/L (ref 101–111)
Creatinine, Ser: 1.06 mg/dL (ref 0.61–1.24)
GFR calc Af Amer: >60 mL/min (ref >60)
GFR calc non Af Amer: >60 mL/min (ref >60)
GLUCOSE: 106 mg/dL — AB (ref 65–99)
POTASSIUM: 3.5 mmol/L (ref 3.5–5.1)
Sodium: 140 mmol/L (ref 135–145)
TOTAL PROTEIN: 7.5 g/dL (ref 6.5–8.1)

## 2016-06-08 LAB — URINALYSIS, ROUTINE W REFLEX MICROSCOPIC
Glucose, UA: NEGATIVE mg/dL
Hgb urine dipstick: NEGATIVE
KETONES UR: 5 mg/dL — AB
Nitrite: POSITIVE — AB
PH: 6 (ref 5.0–8.0)
PROTEIN: 100 mg/dL — AB
Specific Gravity, Urine: 1.026 (ref 1.005–1.030)

## 2016-06-08 LAB — CBC WITH DIFFERENTIAL/PLATELET
BASOS ABS: 0 10*3/uL (ref 0.0–0.1)
BASOS PCT: 0 %
Eosinophils Absolute: 0.1 10*3/uL (ref 0.0–0.7)
Eosinophils Relative: 1 %
HEMATOCRIT: 43.6 % (ref 39.0–52.0)
HEMOGLOBIN: 15.3 g/dL (ref 13.0–17.0)
Lymphocytes Relative: 18 %
Lymphs Abs: 2.4 10*3/uL (ref 0.7–4.0)
MCH: 31 pg (ref 26.0–34.0)
MCHC: 35.1 g/dL (ref 30.0–36.0)
MCV: 88.3 fL (ref 78.0–100.0)
MONOS PCT: 12 %
Monocytes Absolute: 1.6 10*3/uL — ABNORMAL HIGH (ref 0.1–1.0)
NEUTROS ABS: 9.4 10*3/uL — AB (ref 1.7–7.7)
NEUTROS PCT: 69 %
Platelets: 195 10*3/uL (ref 150–400)
RBC: 4.94 MIL/uL (ref 4.22–5.81)
RDW: 12.6 % (ref 11.5–15.5)
WBC: 13.6 10*3/uL — ABNORMAL HIGH (ref 4.0–10.5)

## 2016-06-08 LAB — RAPID URINE DRUG SCREEN, HOSP PERFORMED
Amphetamines: NOT DETECTED
BENZODIAZEPINES: NOT DETECTED
Barbiturates: NOT DETECTED
COCAINE: POSITIVE — AB
OPIATES: NOT DETECTED
Tetrahydrocannabinol: NOT DETECTED

## 2016-06-08 LAB — LIPASE, BLOOD: Lipase: 23 U/L (ref 11–51)

## 2016-06-08 LAB — ACETAMINOPHEN LEVEL

## 2016-06-08 LAB — SALICYLATE LEVEL

## 2016-06-08 LAB — ETHANOL: Alcohol, Ethyl (B): <5 mg/dL (ref <5)

## 2016-06-08 MED ORDER — NICOTINE 21 MG/24HR TD PT24: 21.0000 mg | MEDICATED_PATCH | Freq: Every day | TRANSDERMAL | Status: DC | PRN

## 2016-06-08 MED ORDER — ONDANSETRON HCL 4 MG PO TABS
4.0000 mg | ORAL_TABLET | Freq: Three times a day (TID) | ORAL | Status: DC | PRN
Start: 2016-06-08 — End: 2016-06-10

## 2016-06-08 MED ORDER — IBUPROFEN 200 MG PO TABS: 600.0000 mg | ORAL_TABLET | Freq: Three times a day (TID) | ORAL | Status: DC | PRN

## 2016-06-08 MED ORDER — ACETAMINOPHEN 325 MG PO TABS: 650.0000 mg | ORAL_TABLET | ORAL | Status: DC | PRN

## 2016-06-08 MED ORDER — ALUM & MAG HYDROXIDE-SIMETH 200-200-20 MG/5ML PO SUSP: 30.0000 mL | ORAL | Status: DC | PRN

## 2016-06-08 MED ORDER — ZOLPIDEM TARTRATE 5 MG PO TABS: 5.0000 mg | ORAL_TABLET | Freq: Every evening | ORAL | Status: DC | PRN

## 2016-06-08 NOTE — ED Notes (Signed)
Provided patient decaff coffee and ham sandwich. Provider provided permission for patient to eat.
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
## 2016-06-08 NOTE — ED Triage Notes (Signed)
Patient is having upper right quadrant pain that started a few weeks ago. Patient is having trouble urinating. Patient is not taking his medicine that helps with that. Patient stated he wanted to take a gun and shoot himself. He has been depressed for months. Patient has a drinking heavily.

## 2016-06-08 NOTE — ED Notes (Signed)
Dr. Wentz at bedside. 

## 2016-06-08 NOTE — ED Notes (Signed)
TTS at bedside. 

## 2016-06-08 NOTE — Telephone Encounter (Signed)
Patient requesting a refill of this medication, baclofen 10mg , has not been seen in clinic since April of last year, was no show to last few appointments.  Please advise

## 2016-06-09 ENCOUNTER — Inpatient Hospital Stay (HOSPITAL_COMMUNITY): Admission: EM | Admit: 2016-06-09 | Payer: Medicaid Other | Source: Intra-hospital | Admitting: Psychiatry

## 2016-06-09 DIAGNOSIS — F332 Major depressive disorder, recurrent severe without psychotic features: Secondary | ICD-10-CM | POA: Diagnosis present

## 2016-06-09 DIAGNOSIS — F1090 Alcohol use, unspecified, uncomplicated: Secondary | ICD-10-CM | POA: Diagnosis present

## 2016-06-09 DIAGNOSIS — F1410 Cocaine abuse, uncomplicated: Secondary | ICD-10-CM | POA: Diagnosis present

## 2016-06-09 DIAGNOSIS — F1010 Alcohol abuse, uncomplicated: Secondary | ICD-10-CM | POA: Diagnosis present

## 2016-06-09 MED ORDER — VITAMIN B-1 100 MG PO TABS
100.0000 mg | ORAL_TABLET | Freq: Every day | ORAL | Status: DC
  Administered 2016-06-10: 100 mg via ORAL
  Filled 2016-06-09: qty 1

## 2016-06-09 MED ORDER — CHLORDIAZEPOXIDE HCL 25 MG PO CAPS
25.0000 mg | ORAL_CAPSULE | Freq: Three times a day (TID) | ORAL | Status: DC
  Filled 2016-06-09: qty 1

## 2016-06-09 MED ORDER — CHLORDIAZEPOXIDE HCL 25 MG PO CAPS: 25.0000 mg | ORAL_CAPSULE | Freq: Every day | ORAL | Status: DC

## 2016-06-09 MED ORDER — LOPERAMIDE HCL 2 MG PO CAPS: 2.0000 mg | ORAL_CAPSULE | ORAL | Status: DC | PRN

## 2016-06-09 MED ORDER — CHLORDIAZEPOXIDE HCL 25 MG PO CAPS: 25.0000 mg | ORAL_CAPSULE | ORAL | Status: DC

## 2016-06-09 MED ORDER — CHLORDIAZEPOXIDE HCL 25 MG PO CAPS
25.0000 mg | ORAL_CAPSULE | Freq: Four times a day (QID) | ORAL | Status: AC
  Administered 2016-06-09 (×4): 25 mg via ORAL
  Filled 2016-06-09 (×4): qty 1

## 2016-06-09 MED ORDER — ADULT MULTIVITAMIN W/MINERALS CH
1.0000 | ORAL_TABLET | Freq: Every day | ORAL | Status: DC
  Administered 2016-06-09 – 2016-06-10 (×2): 1 via ORAL
  Filled 2016-06-09 (×2): qty 1

## 2016-06-09 MED ORDER — HYDROXYZINE HCL 25 MG PO TABS: 25.0000 mg | ORAL_TABLET | Freq: Four times a day (QID) | ORAL | Status: DC | PRN

## 2016-06-09 MED ORDER — THIAMINE HCL 100 MG/ML IJ SOLN
100.0000 mg | Freq: Once | INTRAMUSCULAR | Status: AC
  Administered 2016-06-09: 100 mg via INTRAMUSCULAR
  Filled 2016-06-09: qty 2

## 2016-06-09 MED ORDER — CHLORDIAZEPOXIDE HCL 25 MG PO CAPS
25.0000 mg | ORAL_CAPSULE | Freq: Four times a day (QID) | ORAL | Status: DC | PRN
  Administered 2016-06-10: 25 mg via ORAL

## 2016-06-09 NOTE — BH Assessment (Addendum)
[facility]HH Assessment Progress Note  Per Thedore MinsMojeed Akintayo, MD, this pt requires psychiatric hospitalization at this time.  Clint Bolderori Beck, RN, [facility].  Pt has signed Voluntary Admission and Consent for Treatment, as well as Consent to Release Information to his grandmother, and signed forms have [facility].  Pt's nurse, Verlon AuLeslie, has been notified, and agrees to send original paperwork along with pt via [facility]uel [place], and to call report to [phone].  Doylene Canninghomas Kymani Laursen, MA Triage Specialist [phone]  Addendum:  [facility] once a CIWA has been performed, and after 13:00.  Pt's nurse has been notified.  Doylene Canninghomas Huma Imhoff, MA Triage Specialist [phone]

## 2016-06-09 NOTE — ED Notes (Addendum)
Patient states he is a paraplegic but can get around with braces on his legs. Patient states he does not have his braces, they are at home.

## 2016-06-09 NOTE — BHH Counselor (Signed)
[facility] for bed [facility]2 to Dr. Jama Flavorsobos per Clint Bolderori Beck, [facility]. Nursing report # is [phone].  Orlie PollenAshley Lendora Keys, LPC, NCC,  LCAS-A Therapeutic Triage Specialist  06/09/2016 6:04 AM

## 2016-06-09 NOTE — ED Notes (Signed)
Report given to Armando ReichertMarian, RN and patient can't go after 1 pm

## 2016-06-09 NOTE — ED Notes (Signed)
[facility] contacted and said patient can't come until after 1 pm

## 2016-06-09 NOTE — ED Notes (Signed)
Patient requested to leave.  Spoke to Surpriseom and he stated he would reach out to MD and get back with me.

## 2016-06-09 NOTE — ED Notes (Addendum)
Pt stated "I messed up yesterday.  I had plans to go to school in August.  I've bought a Counsellorprinter and everything.  I have 2 classes I go to, NA and AA.  I got my license taken for a DUI.  I go off probation in April.  I moved here to live with my grandmother when I was 15.  I never went to school here but got my GED.  Both my parents are dead.  I did cocaine.  I've done it a couple of times in the past year.  I'm with friends, it's there, so I did it."

## 2016-06-09 NOTE — Progress Notes (Addendum)
CSW [facility] at [phone] [facility] who stated they do have an available bed for the pt.  Corrie DandyMary stated, pt must be IVC'd., however before being accepted by [facility]. CSW stated he understood and will relay infomation to the EDP and then update [facility] on the decision.   6:43 PM CSW informed the pt he had been accepted by [facility], .  Pt stated he wanted to discharge home at this time despite stating he had previously agreed to remaining inpatient voluntarily.  CSW and RN spoke to pt and pt was then agreeable to remain inpatient voluntarily until 2/8. EDP will not be IVC'ing pt at this time. Pt stated he has appointment with his probation officer on the morning of 2/8 and that the pt's grandmother left a VM for the pt's P.O., but that the pt would need a letter sent to the pt's P.O.   Pt's grandmother stated she would bring the PO's number, [facility]. Wagner [facility] so the CSW on duty can send the letter stating pt was inpatient.  Dorothe PeaJonathan F. Dalicia Kisner, Theresia MajorsLCSWA, LCAS Clinical Social Worker Ph: [phone]

## 2016-06-09 NOTE — ED Notes (Signed)
[facility] from [facility]

## 2016-06-09 NOTE — ED Provider Notes (Addendum)
Pt was evaluated by psychiatry this AM.  Inpatient treatment recommended.   [facility]   They requetsed IVC paper work for transfer however pi agrees to be treated. Do not feel that pt needs IVC at this time.   Linwood DibblesJon Maebel Marasco, MD 06/09/16 [id number]
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
## 2016-06-09 NOTE — Treatment Plan (Signed)
[facility]. Upon further review, there were important details related to his functional status that were omitted. After discussion with Unit Director and Assistant Unit Director for the Adult Unit, it was determined that we cannot accommodate the patients physical and safety needs at [facility] and feel like his needs would be better accommodated on a medical unit if he continues to need Inpatient Hospitalization. Psychiatry will be available to consult on the unit as necessary. Discussed decision with Janice Coffinom Hughes in the [facility] who will discuss with Team there and work on disposition

## 2016-06-09 NOTE — ED Notes (Signed)
Bedside [facility]

## 2016-06-09 NOTE — BH Assessment (Addendum)
BHH Assessment Progress Note  Per Lawernce IonEric Schamberg, RN, [facility], pt [facility] at this [facility] due to ADL concerns.  He agrees to staff pt with [facility] for consideration for admission.  Assistant Director Percell BostonAkeysha Rutledge, RN requests that this Clinical research associatewriter explore [place] [facility] [place] [place].  I staffed this with Nanine MeansJamison Lord, DNP, and she agrees that I should pursue this option.  I then spoke to EDP Drema PryPedro Cardama, MD, who agrees to consider this.  Pt has also reported to his nurse that [facility], and Dr Eudelia Bunchardama will consider placing pt under IVC.  In the meantime, I have explored other alternatives.  [facility] reports that their Med Psych unit is currently closed for renovation.  [facility] declined pt for admission to Med Psych due to substance abuse problems.  At 14:27 I called the [facility] and spoke to Golden Hillsonya to request authorization for [facility].  Authorization number is [id number], valid from 06/09/2016 - 06/15/2016.  Please note that authorization does not mean that pt has been accepted to the facility.  At 14:31 I called [facility] and s;oke to Robinette, who took demographic information.  I then faxed referral information [facility].  At 15:10 I spoke to [place] at [facility] who has confirmed receipt of referral packet.  As of this writing, final decisions are pending from [facility] and from Dr Eudelia Bunchardama.  Doylene Canninghomas Dashel Goines, MA Triage Specialist [phone]   Addendum:  After speaking again with Dr Eudelia Bunchardama, it has been determined that pt is not appropriate for admission to [facility] at this time.  [facility] for psychiatry to evaluate in the morning.  [facility] back from [facility] is still pending at this time.  Doylene Canninghomas Jasdeep Kepner, MA Triage Specialist [phone]

## 2016-06-10 DIAGNOSIS — F332 Major depressive disorder, recurrent severe without psychotic features: Secondary | ICD-10-CM | POA: Diagnosis not present

## 2016-06-10 DIAGNOSIS — Z9889 Other specified postprocedural states: Secondary | ICD-10-CM

## 2016-06-10 DIAGNOSIS — F17210 Nicotine dependence, cigarettes, uncomplicated: Secondary | ICD-10-CM

## 2016-06-10 DIAGNOSIS — F1410 Cocaine abuse, uncomplicated: Secondary | ICD-10-CM

## 2016-06-10 DIAGNOSIS — Z809 Family history of malignant neoplasm, unspecified: Secondary | ICD-10-CM

## 2016-06-10 DIAGNOSIS — Z79899 Other long term (current) drug therapy: Secondary | ICD-10-CM | POA: Diagnosis not present

## 2016-06-10 DIAGNOSIS — F1010 Alcohol abuse, uncomplicated: Secondary | ICD-10-CM

## 2016-06-10 MED ORDER — NITROFURANTOIN MONOHYD MACRO 100 MG PO CAPS: 100.0000 mg | ORAL_CAPSULE | Freq: Two times a day (BID) | ORAL | 0 refills | Status: AC

## 2016-06-10 NOTE — BHH Suicide Risk Assessment (Signed)
Suicide Risk Assessment  Discharge Assessment   [facility]   Principal Problem: Major depressive disorder, recurrent severe without psychotic features [facility](HCC) Discharge Diagnoses:  Patient Active Problem List   Diagnosis Date Noted  . Alcohol abuse [F10.10] 06/09/2016  . Cocaine abuse [F14.10] 06/09/2016  . Major depressive disorder, recurrent severe without psychotic features (HCC) [F33.2] 06/09/2016  . 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 Gastroesophageal reflux disease with esophagitis [K21.0] 08/26/2015  . 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 Chalazion of right lower eyelid [H00.12] 06/03/2015  . Abdominal pain [R10.9] 04/01/2015  . Chronic paraplegia (HCC) [G82.20] 10/24/2013  . Suicidal ideations [R45.851] 09/14/2013  . Elevated blood pressure reading without diagnosis of hypertension [R03.0] 05/18/2011  . TOBACCO ABUSE [F17.200] 11/21/2007  . SINUS TACHYCARDIA [I49.8] 10/27/2006  . NEUROGENIC BLADDER [N31.9] 09/30/2006    Total Time spent with patient: 15 minutes  Musculoskeletal: Strength & Muscle Tone: unable to assess; patient in bed during evaluation Gait & Station: unable to assess; patient in bed during evaluation Patient leans: unable to assess; patient in bed during evaluation  Psychiatric Specialty Exam: Blood pressure 135/77, pulse 77, temperature 98 F (36.7 C), temperature source Oral, resp. rate 18, height 5\' 7"  (1.702 m), weight 72.6 kg (160 lb), SpO2 98 %.Body mass index is 25.06 kg/m.  General Appearance: Fairly Groomed  Eye Contact:  Good  Speech:  Clear and Coherent and Normal Rate  Volume:  Normal  Mood:  Anxious  Affect:  Congruent  Thought Process:  Coherent and Goal Directed  Orientation:  Full (Time, Place, and Person)  Thought Content:  Logical  Suicidal Thoughts:  No  Homicidal Thoughts:  No  Memory:  Immediate;   Good Recent;   Good  Judgement:  Fair  Insight:  Fair  Psychomotor  Activity:  Normal  Concentration:  Concentration: Fair and Attention Span: Fair  Recall:  [employer]  Fund of Knowledge:  Fair  Language:  Good  Akathisia:  No  Handed:  Right  AIMS (if indicated):     Assets:  Communication Skills Housing Resilience  ADL's:  Intact  Cognition:  WNL  Sleep:      Mental Status Per Nursing Assessment::   On Admission:     Demographic Factors:  Male, Adolescent or young adult, Caucasian and Low socioeconomic status  Loss Factors: Decline in physical health and Legal issues  Historical Factors: NA  Risk Reduction Factors:   Living with another person, especially a relative  Continued Clinical Symptoms:  Alcohol/Substance Abuse/Dependencies  Cognitive Features That Contribute To Risk:  Closed-mindedness    Suicide Risk:  Minimal: No identifiable suicidal ideation.  Patients presenting with no risk factors but with morbid ruminations; may be classified as minimal risk based on the severity of the depressive symptoms    Plan Of Care/Follow-up recommendations:  Activity:  As tolerated Diet:  Regular Tests:  As determined by PCP Other:  [facility]  Madalyne Husk, PMHNP-BC, FNP-BC [facility] 06/10/2016, 10:51 AM

## 2016-06-10 NOTE — Progress Notes (Signed)
CSW spoke with patient at bedside, accompanied by grandmother. CSW inquired about patient's request for a letter for his probation officer. Patient reported that he still needed a note and that he would take the note with him. CSW provided patient with requested note. CSW inquired if patient required any additional resources, patient replied no.

## 2016-06-10 NOTE — BH Assessment (Signed)
BHH Assessment Progress Note  Per Thedore MinsMojeed Akintayo, MD, this pt does not meet criteria for IVC, and does not require psychiatric hospitalization at this time.  [facility]  This has been included in pt's discharge instructions.  Pt's nurse has been notified.  [facility] and [facility] have been notified that pt will not need a bed.  Doylene Canninghomas Iann Rodier, MA Triage Specialist [phone]
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
## 2016-07-14 ENCOUNTER — Ambulatory Visit: Payer: Medicaid Other | Admitting: Gastroenterology

## 2016-08-05 ENCOUNTER — Emergency Department (HOSPITAL_COMMUNITY): Payer: Medicaid Other

## 2016-08-05 ENCOUNTER — Emergency Department (HOSPITAL_COMMUNITY)
Admission: EM | Admit: 2016-08-05 | Discharge: 2016-08-05 | Disposition: A | Discharge status: Home / Self Care | Payer: Medicaid Other | Attending: Emergency Medicine | Admitting: Emergency Medicine

## 2016-08-05 DIAGNOSIS — Y999 Unspecified external cause status: Secondary | ICD-10-CM | POA: Insufficient documentation

## 2016-08-05 DIAGNOSIS — W228XXA Striking against or struck by other objects, initial encounter: Secondary | ICD-10-CM | POA: Insufficient documentation

## 2016-08-05 DIAGNOSIS — Y929 Unspecified place or not applicable: Secondary | ICD-10-CM | POA: Insufficient documentation

## 2016-08-05 DIAGNOSIS — S62326A Displaced fracture of shaft of fifth metacarpal bone, right hand, initial encounter for closed fracture: Secondary | ICD-10-CM

## 2016-08-05 DIAGNOSIS — S6991XA Unspecified injury of right wrist, hand and finger(s), initial encounter: Secondary | ICD-10-CM | POA: Diagnosis present

## 2016-08-05 DIAGNOSIS — F17210 Nicotine dependence, cigarettes, uncomplicated: Secondary | ICD-10-CM | POA: Insufficient documentation

## 2016-08-05 DIAGNOSIS — Y9389 Activity, other specified: Secondary | ICD-10-CM | POA: Diagnosis not present

## 2016-08-05 DIAGNOSIS — Z79899 Other long term (current) drug therapy: Secondary | ICD-10-CM | POA: Diagnosis not present

## 2016-08-05 DIAGNOSIS — I10 Essential (primary) hypertension: Secondary | ICD-10-CM | POA: Diagnosis not present

## 2016-08-05 MED ORDER — HYDROCODONE-ACETAMINOPHEN 5-325 MG PO TABS: 1.0000 | ORAL_TABLET | Freq: Four times a day (QID) | ORAL | 0 refills | Status: DC | PRN

## 2016-08-05 MED ORDER — IBUPROFEN 600 MG PO TABS: 600.0000 mg | ORAL_TABLET | Freq: Four times a day (QID) | ORAL | 0 refills | Status: DC | PRN

## 2016-08-05 MED ORDER — OXYCODONE-ACETAMINOPHEN 5-325 MG PO TABS
1.0000 | ORAL_TABLET | Freq: Once | ORAL | Status: AC
  Administered 2016-08-05: 1 via ORAL
  Filled 2016-08-05: qty 1
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
## 2016-08-05 NOTE — Progress Notes (Signed)
Orthopedic Tech Progress Note Patient Details:  James Richard January 05, 1991 [id number]  Ortho Devices Type of Ortho Device: Ace wrap, Ulna gutter splint Ortho Device/Splint Location: rue Ortho Device/Splint Interventions: Application   James Richard 08/05/2016, 11:40 AM

## 2016-08-05 NOTE — Discharge Instructions (Signed)
Ice - ice for 20 minutes at a time, several times a day Elevate - to reduce swelling Ibuprofen - take with food. Take up to 3-4 times daily Take pain medicine as needed for severe pain

## 2016-08-09 ENCOUNTER — Telehealth: Payer: Self-pay | Admitting: Family Medicine

## 2016-08-09 DIAGNOSIS — S62306A Unspecified fracture of fifth metacarpal bone, right hand, initial encounter for closed fracture: Secondary | ICD-10-CM

## 2016-08-09 NOTE — Telephone Encounter (Signed)
Mother called because her son broke his arm and needs a referral to Dr. Merrilee Seashore office . He has an appointment for this Friday. jw

## 2016-08-11 NOTE — Telephone Encounter (Signed)
Referral placed.

## 2016-10-25 ENCOUNTER — Ambulatory Visit: Payer: Self-pay | Admitting: Family Medicine

## 2017-02-03 ENCOUNTER — Encounter (HOSPITAL_COMMUNITY): Payer: Self-pay

## 2017-02-03 ENCOUNTER — Emergency Department (HOSPITAL_COMMUNITY)
Admission: EM | Admit: 2017-02-03 | Discharge: 2017-02-03 | Disposition: A | Discharge status: Home / Self Care | Payer: Medicaid Other | Attending: Emergency Medicine | Admitting: Emergency Medicine

## 2017-02-03 DIAGNOSIS — Z5321 Procedure and treatment not carried out due to patient leaving prior to being seen by health care provider: Secondary | ICD-10-CM | POA: Diagnosis not present

## 2017-02-03 DIAGNOSIS — F10929 Alcohol use, unspecified with intoxication, unspecified: Secondary | ICD-10-CM | POA: Diagnosis present

## 2017-02-03 NOTE — ED Notes (Signed)
Pt refuses to get in bed
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
## 2017-02-03 NOTE — ED Notes (Signed)
Bacitracin applied to pt's chin

## 2017-02-03 NOTE — ED Notes (Signed)
Pt's IV was removed because he stated he was leaving

## 2017-02-03 NOTE — ED Triage Notes (Signed)
Pt's blood pressure is elevated per EMS and that was the main concern and reason for transport to the hospital

## 2017-04-08 ENCOUNTER — Other Ambulatory Visit: Payer: Self-pay

## 2017-04-08 ENCOUNTER — Encounter: Payer: Self-pay | Admitting: Psychology

## 2017-04-08 ENCOUNTER — Encounter: Payer: Self-pay | Admitting: Internal Medicine

## 2017-04-08 ENCOUNTER — Ambulatory Visit (INDEPENDENT_AMBULATORY_CARE_PROVIDER_SITE_OTHER): Payer: Medicaid Other | Admitting: Internal Medicine

## 2017-04-08 VITALS — BP 150/74 | HR 114 | Temp 98.1°F | Ht 67.0 in | Wt 160.0 lb

## 2017-04-08 DIAGNOSIS — G8220 Paraplegia, unspecified: Secondary | ICD-10-CM | POA: Diagnosis not present

## 2017-04-08 DIAGNOSIS — R4586 Emotional lability: Secondary | ICD-10-CM | POA: Diagnosis not present

## 2017-04-08 DIAGNOSIS — F332 Major depressive disorder, recurrent severe without psychotic features: Secondary | ICD-10-CM

## 2017-04-08 DIAGNOSIS — I10 Essential (primary) hypertension: Secondary | ICD-10-CM

## 2017-04-08 DIAGNOSIS — Z1159 Encounter for screening for other viral diseases: Secondary | ICD-10-CM

## 2017-04-08 DIAGNOSIS — F1010 Alcohol abuse, uncomplicated: Secondary | ICD-10-CM | POA: Diagnosis not present

## 2017-04-08 DIAGNOSIS — Z23 Encounter for immunization: Secondary | ICD-10-CM

## 2017-04-08 MED ORDER — HYDROCHLOROTHIAZIDE 12.5 MG PO TABS: 12.5000 mg | ORAL_TABLET | Freq: Every day | ORAL | 0 refills | Status: DC

## 2017-04-08 NOTE — Assessment & Plan Note (Signed)
Patient with history of HTN previously on metoprolol. Given report of recent cocaine abuse, elected to use beta blockers although patient is tachycardic and would likely benefit from beta blocker use. Will start with HCTZ 12.5 mg. Return in 2 weeks for RN BP check and repeat BMET.  - hydrochlorothiazide (HYDRODIURIL) 12.5 MG tablet; Take 1 tablet (12.5 mg total) by mouth daily.  Dispense: 90 tablet; Refill: 0 - Basic Metabolic Panel - Basic Metabolic Panel; Future

## 2017-04-08 NOTE — Assessment & Plan Note (Signed)
-   Amb referral to Pediatric Physical Medicine Rehab

## 2017-04-08 NOTE — Assessment & Plan Note (Signed)
Patient with moderate anxiety by GAD-7 and reports a significant amount of anxiety type symptoms. However, with positive screening for MDQ have elected not to prescribe SSRI. No SI and patient is not a harm to himself at present so believe stable for outpatient follow up. [facility] by James Richard, [facility], [facility]. To James Richard, patient did endorse passive SI while alone at night drinking without an active plan. Patient reportedly did not see this as a problem. Has been referred to psychiatry for further evaluation and medication management. Resources for suicide hotline provided.

## 2017-04-08 NOTE — Patient Instructions (Addendum)
It was nice to meet you today. Today we talked about:   1. Your blood pressure. I have prescribed a medication called Hydrochlorothiazide at a low dose. Please return in 2 weeks for a BP check and repeat labs. We will also check basic labs today and call you about the results.   2. Your mood. Your screening was positive for moderate anxiety as well as a positive screen for a mood disorder. Therefore, we are going to get you touch with the appropriate person to do a full mood evaluation and prescribe the correct medications.   3. I have placed a referral to physical medicine and rehabilitation doctor. They are the ones who can help determine therapy for your legs and back. You should receive a phone call soon about this appointment.

## 2017-04-08 NOTE — Assessment & Plan Note (Signed)
Assessment / Plan / Recommendations: Patient is presenting with depressive and anxiety symptoms and is intereted in therapy. He denied SI on the PHQ9, but reported passive [facility] during warm[facility]hand off. He reported these occur at night only and he denied any plan or intent to harm self. He reported he is not concerned about these thoughts and is focusing on maintaining gains since stopping his drug use. [facility] provided resources for several suicide hotlines and let him know he can go to the ED if thoughts intensify. He reported understanding and willingness to go to ED if thoughts intensified. He also reported he has shared these thoughts with his sister and she has provided support in these times. To distract himself he likes to work out.  [facility] also discussed the link between alcohol use and depression and patient reported understanding.   Based on positive screen on PHQ9, [facility] provided patient with the mood clinic's information for further assessment and medication management. Patient will call to schedule appointment.   Warmhandoff:  Warm hand-off complete.
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
## 2017-04-08 NOTE — Progress Notes (Signed)
Subjective:    James Richard - 26 y.o. male MRN [id number]  Date of birth: July 13, 1990  HPI  James MorinBradley Previti is here for follow up.  HTN: Reports history of hypertension. States he was previously on BP medications in the past but has been without them for at least two years. Denies chest pain, vision changes, and severe headaches.   Chronic Paraplegia: Requests referral to "back doctor". States he was going to Dr. Wynn BankerKirsteins previously for therapy and wanted to work harder on getting his leg function back.   Alcohol Abuse: Patient reports he started drinking heavily after his father died two years ago as a way to escape "feeling down" and to help him sleep. States at most he will drink three days a week. When he does drink, he averages 12 beers at once. Denies symptoms of withdrawal of alcohol including tremors, nausea/vomiting, diaphoresis, hallucinations, and seizures. CAGE questionnaire verbally completed and patient responded yes to feeling need to cut back and to feeling guilty about his drinking. Responded no to feeling annoyed by others criticizing his drinking and no to use of an "eye opener".   Mood Disturbance:  Presenting Issue: Feeling down and wanting medication to help.   Report of symptoms: Feels down and sad over the loss of both of his parents. Has had panic attacks and sometimes feels anxious about just going outside.   Duration of CURRENT symptoms: 2 years  Age of onset of first mood disturbance: Teenager   Impact on function:Somewhat difficult. Reports trouble sleeping and coping with alcohol.   Psychiatric History - Diagnoses: PTSD per patient, MDD is listed on problem list in EMR  - Hospitalizations:  None  - Pharmacotherapy: None  - Outpatient therapy: Went to therapy weekly for a period of time.   Family history of psychiatric issues: Reports grandmother and sister with history of depression. However, reports family history of bipolar disorder on MDQ.   Current  and history of substance use: Current alcohol abuse. Was abusing cocaine as recently as one year ago.   PHQ-9:6 MDQ:10  GAD7:8  Health Maintenance Due  Topic Date Due  . HIV Screening  10/10/2004  . INFLUENZA VACCINE  12/01/2016    -  reports that he has been smoking cigarettes.  He has a 7.00 pack-year smoking history. he has never used smokeless tobacco. - Review of Systems: Per HPI. - Past Medical History: Patient Active Problem List   Diagnosis Date Noted  . Alcohol abuse 06/09/2016  . Cocaine abuse (HCC) 06/09/2016  . Major depressive disorder, recurrent severe without psychotic features (HCC) 06/09/2016  . 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 Gastroesophageal reflux disease with esophagitis 08/26/2015  . Spastic paraplegia 08/26/2015  . Chalazion of right lower eyelid 06/03/2015  . Abdominal pain 04/01/2015  . Chronic paraplegia (HCC) 10/24/2013  . Suicidal ideations 09/14/2013  . Elevated blood pressure reading without diagnosis of hypertension 05/18/2011  . TOBACCO ABUSE 11/21/2007  . SINUS TACHYCARDIA 10/27/2006  . 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 Bilateral foot drop using AFOs. Upper extremity strength intact.  Psych: good insight, alert and oriented   Assessment & Plan:   1. Essential hypertension Patient with history of HTN previously on metoprolol. Given report of recent cocaine abuse, elected to use beta blockers although patient is  tachycardic and would likely benefit from beta blocker use. Will start with HCTZ 12.5 mg. Return in 2 weeks for RN BP check and repeat BMET.  - hydrochlorothiazide (HYDRODIURIL) 12.5 MG tablet; Take 1 tablet (12.5 mg total) by mouth daily.  Dispense: 90 tablet; Refill: 0 - Basic Metabolic Panel - Basic Metabolic Panel; Future  2. Chronic paraplegia (HCC) - Amb referral to Pediatric Physical Medicine Rehab  3. Mood disturbance Patient with moderate anxiety by GAD-7 and reports a significant amount of anxiety type symptoms. However, with positive screening for MDQ have elected not to prescribe SSRI. No SI and patient is not a harm to himself at present so believe stable for outpatient follow up. Seen by Byrd HesselbachMaria, [facility], today. To Byrd HesselbachMaria, patient did endorse passive SI while alone at night drinking without an active plan. Patient reportedly did not see this as a problem. Has been referred to psychiatry for further evaluation and medication management. Resources for suicide hotline provided.   4. Alcohol abuse Currently dependent on alcohol. No withdrawal symptoms per history. Patient wants to quit or cut back on drinking. [facility]. Patient given outpatient resources for alcohol abuse.   5. Need for immunization against influenza - Flu Vaccine QUAD 36+ mos IM  6. Screening for viral disease - HIV antibody (with reflex)  Marcy Sirenatherine Percy Comp, D.O. 04/08/2017, 3:09 PM PGY-3, [facility]

## 2017-04-08 NOTE — Assessment & Plan Note (Signed)
Currently dependent on alcohol. No withdrawal symptoms per history. Patient wants to quit or cut back on drinking. [facility]. Patient given outpatient resources for alcohol abuse.

## 2017-04-09 LAB — BASIC METABOLIC PANEL
BUN/Creatinine Ratio: 14 (ref 9–20)
BUN: 14 mg/dL (ref 6–20)
CHLORIDE: 101 mmol/L (ref 96–106)
CO2: 27 mmol/L (ref 20–29)
CREATININE: 1 mg/dL (ref 0.76–1.27)
Calcium: 9.4 mg/dL (ref 8.7–10.2)
GFR calc Af Amer: 119 mL/min/{1.73_m2} (ref >59)
GFR calc non Af Amer: 103 mL/min/{1.73_m2} (ref >59)
GLUCOSE: 81 mg/dL (ref 65–99)
POTASSIUM: 4.4 mmol/L (ref 3.5–5.2)
SODIUM: 140 mmol/L (ref 134–144)

## 2017-04-09 LAB — HIV ANTIBODY (ROUTINE TESTING W REFLEX): HIV SCREEN 4TH GENERATION: NONREACTIVE

## 2017-04-13 ENCOUNTER — Encounter: Payer: Self-pay | Admitting: Internal Medicine

## 2017-04-13 NOTE — Progress Notes (Signed)
Letter with lab results created.   Marcy Sirenatherine Lasalle Abee, D.O. 04/13/2017, 8:43 AM PGY-3, [facility]

## 2017-05-23 ENCOUNTER — Ambulatory Visit: Payer: Medicare Other | Admitting: Family Medicine

## 2017-05-23 ENCOUNTER — Other Ambulatory Visit: Payer: Self-pay

## 2017-05-23 ENCOUNTER — Encounter: Payer: Self-pay | Admitting: Family Medicine

## 2017-05-23 DIAGNOSIS — J069 Acute upper respiratory infection, unspecified: Secondary | ICD-10-CM | POA: Diagnosis present

## 2017-05-23 DIAGNOSIS — B9789 Other viral agents as the cause of diseases classified elsewhere: Secondary | ICD-10-CM

## 2017-05-23 NOTE — Patient Instructions (Addendum)
You were seen in clinic today for blood in your cough which is most likely related to an upper respiratory viral infection.  As we discussed, coughing can irritate the lining of your throat and cause you to have small amounts of blood.  If you have worsening of your symptoms, or notice a large amount of blood, I would like for you to follow up with a provider.  Additionally, shortness of breath, chest pain or worsening of your sore throat would be signs to return and be seen.  I expect your symptoms should clear over the next several days.  In the meantime, it is important that you stay well hydrated and make sure you wash hands frequently to avoid spread to family members.  I hope you start to feel better soon!    Viral Respiratory Infection A viral respiratory infection is an illness that affects parts of the body used for breathing, like the lungs, nose, and throat. It is caused by a germ called a virus. Some examples of this kind of infection are:  A cold.  The flu (influenza).  A respiratory syncytial virus (RSV) infection.  How do I know if I have this infection? Most of the time this infection causes:  A stuffy or runny nose.  Yellow or green fluid in the nose.  A cough.  Sneezing.  Tiredness (fatigue).  Achy muscles.  A sore throat.  Sweating or chills.  A fever.  A headache.  How is this infection treated? If the flu is diagnosed early, it may be treated with an antiviral medicine. This medicine shortens the length of time a person has symptoms. Symptoms may be treated with over-the-counter and prescription medicines, such as:  Expectorants. These make it easier to cough up mucus.  Decongestant nasal sprays.  Doctors do not prescribe antibiotic medicines for viral infections. They do not work with this kind of infection. How do I know if I should stay home? To keep others from getting sick, stay home if you have:  A fever.  A lasting cough.  A sore  throat.  A runny nose.  Sneezing.  Muscles aches.  Headaches.  Tiredness.  Weakness.  Chills.  Sweating.  An upset stomach (nausea).  Follow these instructions at home:  Rest as much as possible.  Take over-the-counter and prescription medicines only as told by your doctor.  Drink enough fluid to keep your pee (urine) clear or pale yellow.  Gargle with salt water. Do this 3-4 times per day or as needed. To make a salt-water mixture, dissolve -1 tsp of salt in 1 cup of warm water. Make sure the salt dissolves all the way.  Use nose drops made from salt water. This helps with stuffiness (congestion). It also helps soften the skin around your nose.  Do not drink alcohol.  Do not use tobacco products, including cigarettes, chewing tobacco, and e-cigarettes. If you need help quitting, ask your doctor. Get help if:  Your symptoms last for 10 days or longer.  Your symptoms get worse over time.  You have a fever.  You have very bad pain in your face or forehead.  Parts of your jaw or neck become very swollen. Get help right away if:  You feel pain or pressure in your chest.  You have shortness of breath.  You faint or feel like you will faint.  You keep throwing up (vomiting).  You feel confused. This information is not intended to replace advice given to you by  your health care provider. Make sure you discuss any questions you have with your health care provider. Document Released: 04/01/2008 Document Revised: 09/25/2015 Document Reviewed: 09/25/2014 Elsevier Interactive Patient Education  2018 [employer].
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
## 2017-05-26 DIAGNOSIS — B9789 Other viral agents as the cause of diseases classified elsewhere: Principal | ICD-10-CM

## 2017-05-26 DIAGNOSIS — J069 Acute upper respiratory infection, unspecified: Secondary | ICD-10-CM | POA: Insufficient documentation

## 2017-05-26 NOTE — Assessment & Plan Note (Signed)
Symptoms c/w resolving viral URI.  No red flags on exam and lung exam unremarkable. Vitals stable and patient appears well hydrated.  Likely small amount of blood noted in sputum is 2/2 persistent irritation from clearing throat and cough.  Discussed with pt and grandmother that antibiotics would not be beneficial in a viral illness.   -advised frequent handwashing -Tylenol prn for fever/discomfort -advised plenty of warm fluids and rest -return precautions discussed

## 2017-07-05 IMAGING — CR DG HAND COMPLETE 3+V*R*
3 series · 3 of 3 positions shown · non-contrast
Comparison: August 16, 2015

CLINICAL DATA: Injury fifth metacarpal.  Pain and swelling

EXAM:
RIGHT HAND - COMPLETE 3+ VIEW

[hand pa]
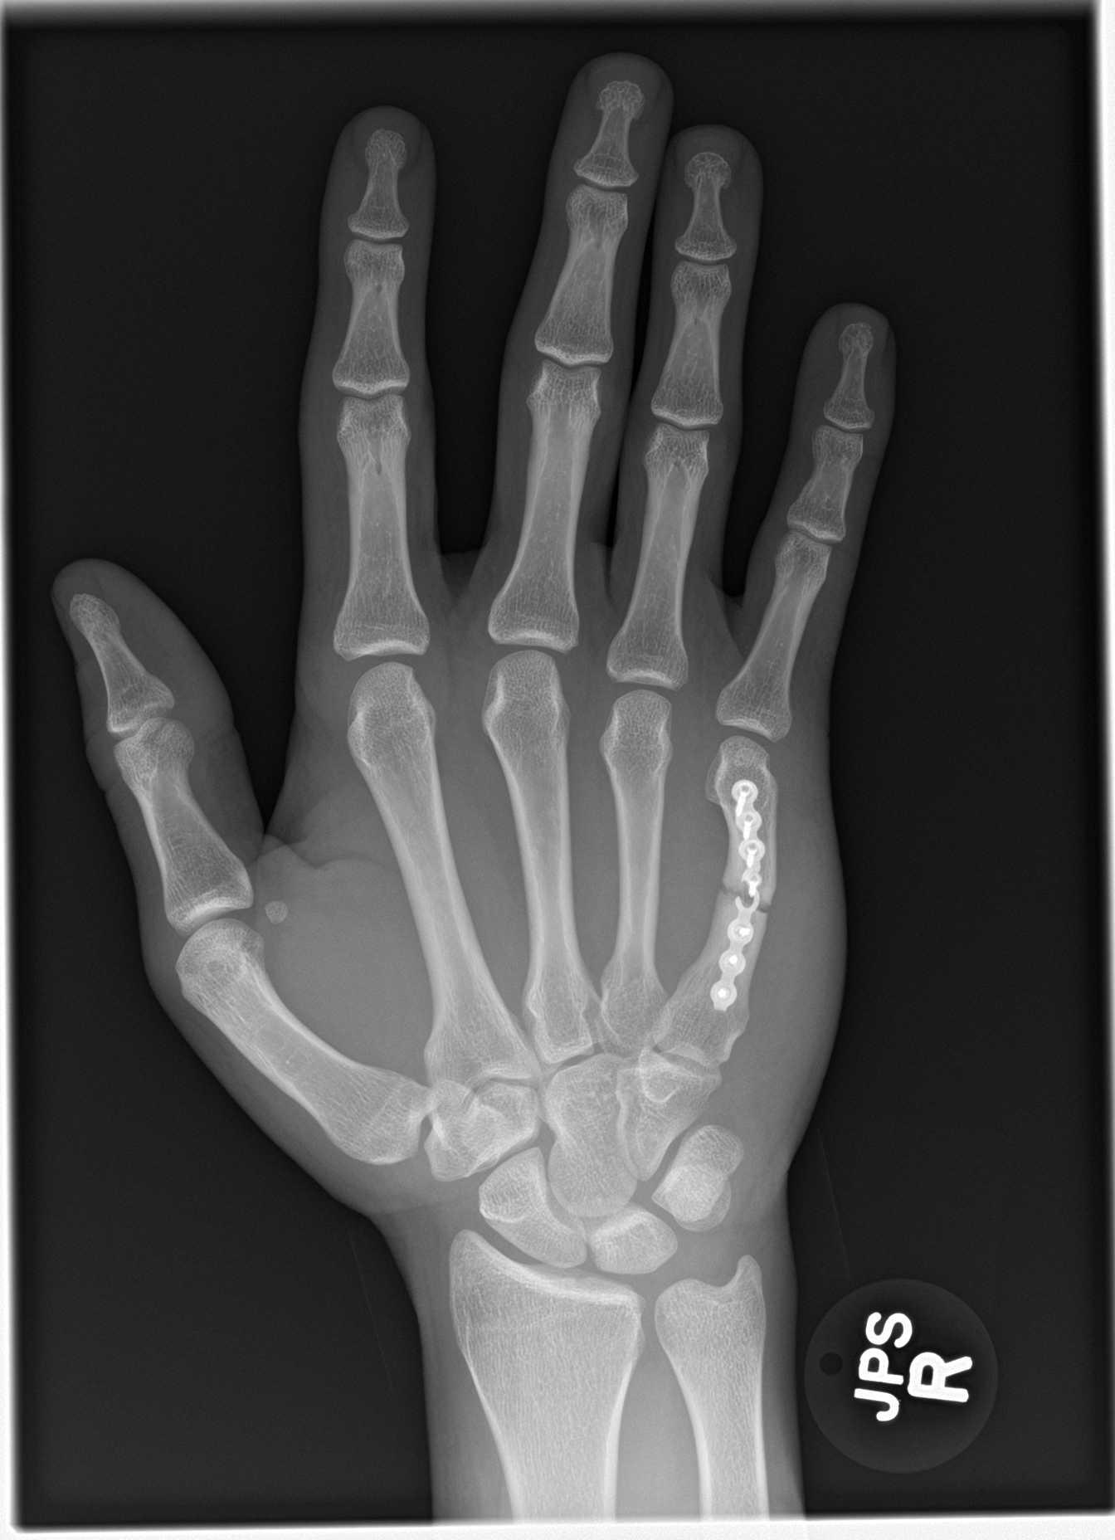

[hand obl]
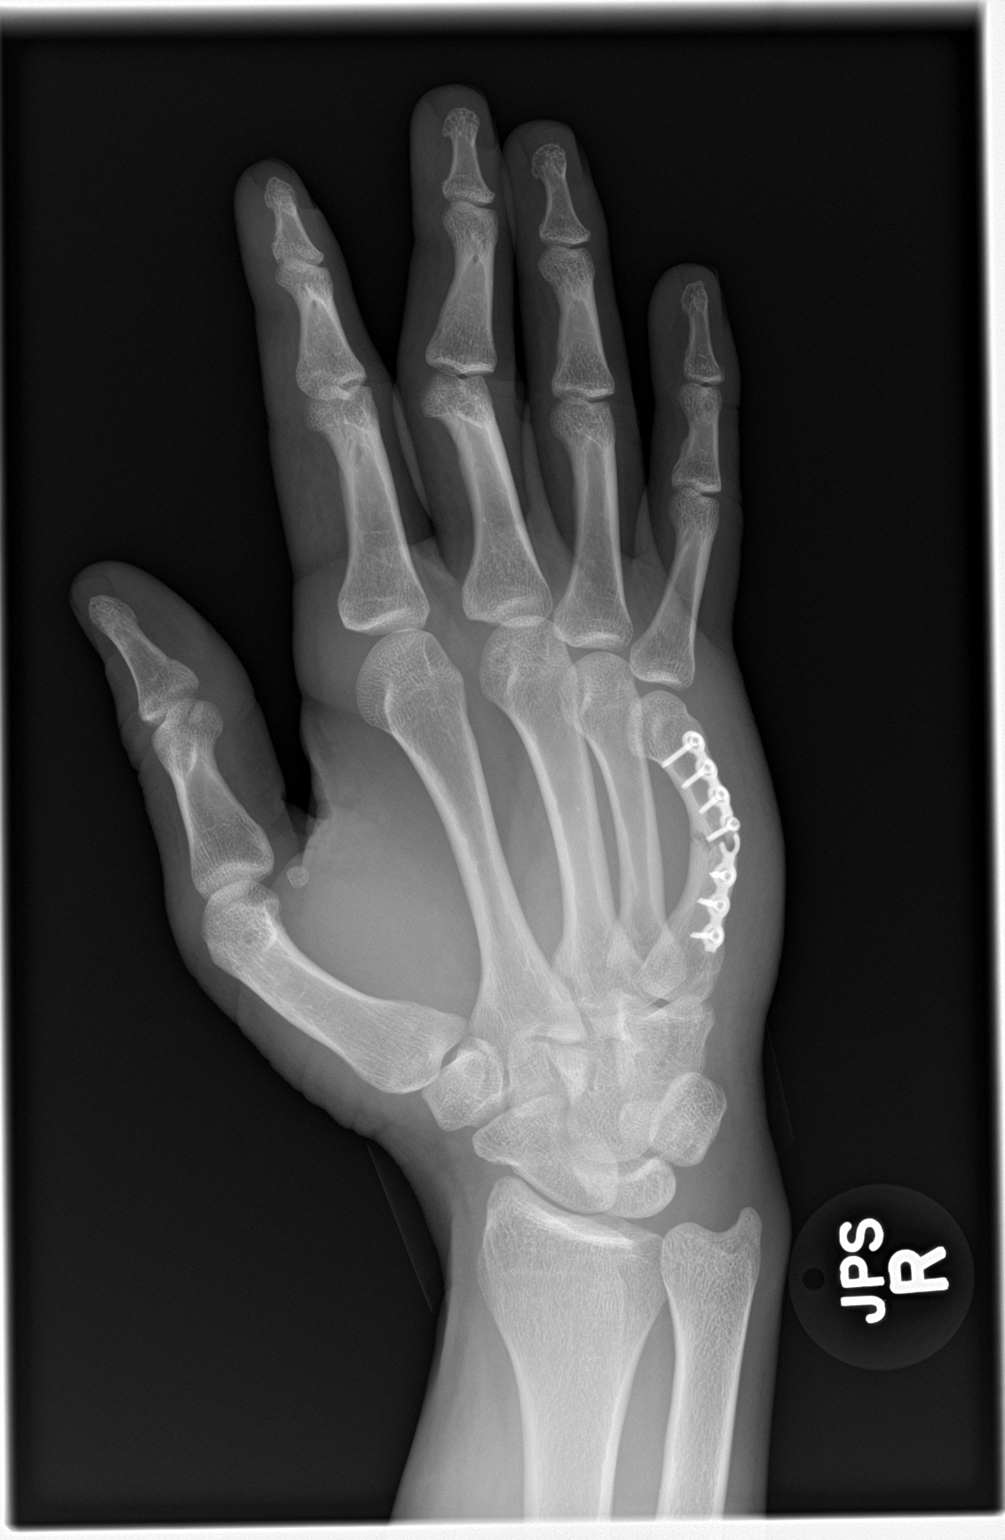

[hand lat]
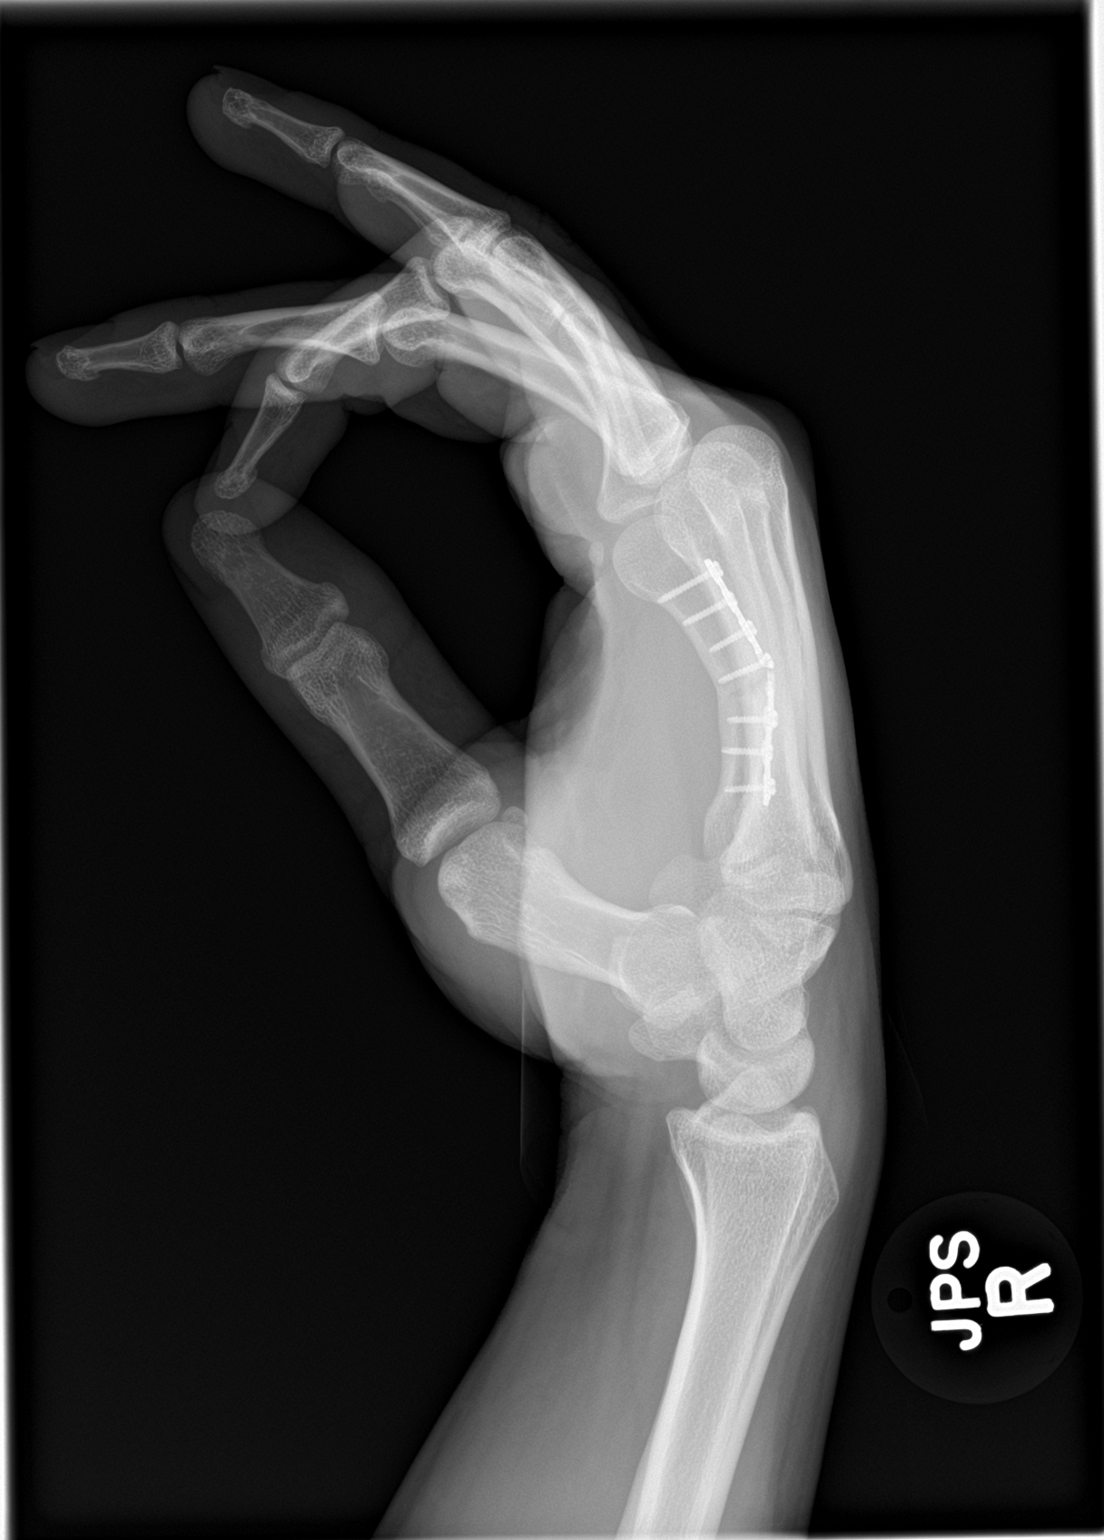

[3 of 3 positions shown; findings below may reference images not displayed]

FINDINGS: Frontal, oblique, lateral views were obtained. The patient has had
previous screw and plate fixation for a fracture of the fifth
metacarpal. There is a fracture within the midportion of the screw
and plate fixation device. In this immediate area, there is a
transversely oriented fracture of the fifth metacarpal midportion
with slight separation of fracture fragments. There is volar
angulation distally. No other fractures are evident. No dislocation.
Joint spaces appear normal.
IMPRESSION: Evidence of re-injury through the mid fifth metacarpal with both
acute and nonacute appearing components to a fracture in this area.
There is volar angulation distally with as well as a fracture in a
previous screw and plate fixation device at the site acute component
of the fracture. There is slightly less than 2 mm of displacement of
fracture fragments in this area.

No other fractures. No dislocations. No apparent arthropathic
change.

## 2017-07-10 ENCOUNTER — Emergency Department (HOSPITAL_COMMUNITY)
Admission: EM | Admit: 2017-07-10 | Discharge: 2017-07-10 | Discharge status: Against Medical Advice | Payer: Medicare Other

## 2017-07-10 NOTE — ED Notes (Signed)
Pt came with Grandmother. Pt no longer in lobby.

## 2017-07-10 NOTE — ED Notes (Signed)
CAlled x3. No answer.

## 2017-07-10 NOTE — ED Notes (Signed)
Bed: [facility]4 Expected date:  Expected time:  Means of arrival:  Comments: 

## 2017-07-10 NOTE — ED Notes (Signed)
Bed: [facility]1 Expected date:  Expected time:  Means of arrival:  Comments: 

## 2017-07-10 NOTE — ED Notes (Signed)
No answer from lobby x 2 

## 2017-07-11 ENCOUNTER — Telehealth: Payer: Self-pay | Admitting: Psychology

## 2017-07-11 NOTE — Telephone Encounter (Signed)
Called James Richard back with a recommendation to [facility].  In questioning him further, he reports he hasn't had anything to drink for three days.  He denies feeling confused or having tremors.  He also denies sweating.  He is coherent and logical on the phone.    I asked him to call [facility] and ask for outpatient psychiatry and to inquire about their partial hospitalization program.  I provided the phone numbers and asked him to call me back if he had any difficulty.    If he starts having confusion, tremors, nausea or vomiting or has suicidal ideation, I recommended he go to the Emergency Department.  He voiced an understanding.
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
## 2017-07-13 ENCOUNTER — Ambulatory Visit (HOSPITAL_COMMUNITY)
Admission: RE | Admit: 2017-07-13 | Discharge: 2017-07-13 | Disposition: A | Discharge status: Home / Self Care | Payer: Medicare Other | Attending: Psychiatry | Admitting: Psychiatry

## 2017-07-13 ENCOUNTER — Telehealth (HOSPITAL_COMMUNITY): Payer: Self-pay | Admitting: Psychiatry

## 2017-07-13 ENCOUNTER — Encounter: Payer: Self-pay | Admitting: Psychology

## 2017-07-13 DIAGNOSIS — F1010 Alcohol abuse, uncomplicated: Secondary | ICD-10-CM | POA: Insufficient documentation

## 2017-07-13 DIAGNOSIS — G4700 Insomnia, unspecified: Secondary | ICD-10-CM | POA: Insufficient documentation
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
## 2017-07-13 NOTE — Progress Notes (Addendum)
James Richard presented to our office with his grandmother.  I had spoken to him on the phone on Monday and suggested [facility].  He said he called and they told him he could walk in for an assessment.  He decided to do that and [facility] ([facility]aking[facility].  They sent him to our office.  James Richard reports he has not had alcohol since Saturday.  He thinks he is through any withdrawal.  He is feeling well today but worried that he will deteriorate in mood and / or drink again and he is trying to avoid that.    My hope is that he can get connected with outpatient psychiatry and / or partial hospitalization to address substance use and mood issues.  [facility] is not a great fit for him but he states that perhaps he has not yet found the right group.  He is interested in getting a job and regaining some independence.  It sounds like substance use has gotten in the way of that in the past.  He has attended [facility] in the past and may be a good candidate again if he can remain sober.    I called [facility] and let them know he was coming.    I asked him to call me as needed but I will plan to follow up with him on Friday.

## 2017-07-13 NOTE — H&P (Addendum)
Behavioral Health Medical Screening Exam  James MorinBradley Richard is an 27 y.o. male patient [facility] requesting resource information for alcohol abuse.  Patient denies suicidal/self-harm/homicidal ideation, psychosis, and paranoia.  States he only needs outpatient resources.    Total Time spent with patient: 30 minutes  Psychiatric Specialty Exam: Physical Exam  Constitutional: He is oriented to person, place, and time.  Neck: Normal range of motion. Neck supple.  Respiratory: Effort normal.  Musculoskeletal: Normal range of motion.  Neurological: He is alert and oriented to person, place, and time.  Skin: Skin is warm and dry.    Review of Systems  Psychiatric/Behavioral: Positive for substance abuse (ETOH). Negative for suicidal ideas. The patient has insomnia. The patient is not nervous/anxious.   All other systems reviewed and are negative.   Blood pressure (!) 183/101, pulse 91, temperature 98 F (36.7 C), resp. rate 18.There is no height or weight on file to calculate BMI.  General Appearance: Casual and Neat  Eye Contact:  Good  Speech:  Clear and Coherent and Normal Rate  Volume:  Normal  Mood:  appropriate  Affect:  Appropriate  Thought Process:  Coherent and Goal Directed  Orientation:  Full (Time, Place, and Person)  Thought Content:  Logical  Suicidal Thoughts:  No  Homicidal Thoughts:  No  Memory:  Immediate;   Good Recent;   Good Remote;   Good  Judgement:  Intact  Insight:  Present  Psychomotor Activity:  Normal  Concentration: Concentration: Good and Attention Span: Good  Recall:  Good  Fund of Knowledge:Good  Language: Good  Akathisia:  No  Handed:  Right  AIMS (if indicated):     Assets:  Communication Skills Housing Social Support  Sleep:       Musculoskeletal: Strength & Muscle Tone: within normal limits Gait & Station: Wheel chair Patient leans: N/A  Blood pressure (!) 183/101, pulse 91, temperature 98 F (36.7 C), resp.  rate 18.  Patient informed he has stop taking his Norvasc because he saw on TV where it could cause cancer.  Encouraged to continue taking until he was able to see his PCP; States that he is able to do a walk in with PCP today.      Recommendations:  [facility] resource information alcohol/substance abuse; referral to IOP  Based on my evaluation the patient does not appear to have an emergency medical condition.   Recommendations:  CD IOP referral.  Follow up with PCP related to Blood pressure   Disposition: No evidence of imminent risk to self or others at present.   Patient does not meet criteria for psychiatric inpatient admission. Refer to IOP.   Shuvon Rankin, NP 07/13/2017, 2:45 PM

## 2017-07-13 NOTE — Telephone Encounter (Signed)
D:  Kim from TTS (ext. [phone]) referred pt to IOP.  A:  Placed call to pt, but there was no answer.  Will discuss patient with CD-IOP.  After reading Dr. Carola RhineKane's note; there's a hx of ETOH and pt wants to maintain abstinence.   Jeri Modenaita Tiana Sivertson, M.Ed, CNA

## 2017-07-18 ENCOUNTER — Telehealth: Payer: Self-pay | Admitting: Psychology

## 2017-07-18 NOTE — Telephone Encounter (Signed)
Called patient to see what his plans were regarding [facility] and the recommendation that they made for Intensive outpatient.  I spoke to [name] who said he was awaiting a phone call to find out more about the IOP [facility].  I let him know [name] had tried to call him and provided her number for him to reach out.  He said he plans to do that.

## 2017-07-29 ENCOUNTER — Ambulatory Visit: Payer: Medicare Other | Admitting: Internal Medicine

## 2020-02-04 DIAGNOSIS — G8220 Paraplegia, unspecified: Secondary | ICD-10-CM | POA: Diagnosis not present

## 2020-02-04 DIAGNOSIS — F321 Major depressive disorder, single episode, moderate: Secondary | ICD-10-CM | POA: Diagnosis not present

## 2020-02-04 DIAGNOSIS — F4310 Post-traumatic stress disorder, unspecified: Secondary | ICD-10-CM | POA: Diagnosis not present

## 2020-03-20 DIAGNOSIS — Z993 Dependence on wheelchair: Secondary | ICD-10-CM | POA: Diagnosis not present

## 2020-03-20 DIAGNOSIS — G8220 Paraplegia, unspecified: Secondary | ICD-10-CM | POA: Diagnosis not present

## 2020-04-18 DIAGNOSIS — L989 Disorder of the skin and subcutaneous tissue, unspecified: Secondary | ICD-10-CM | POA: Diagnosis not present

## 2020-04-19 DIAGNOSIS — G8220 Paraplegia, unspecified: Secondary | ICD-10-CM | POA: Diagnosis not present

## 2020-04-19 DIAGNOSIS — Z993 Dependence on wheelchair: Secondary | ICD-10-CM | POA: Diagnosis not present

## 2020-05-20 DIAGNOSIS — G8220 Paraplegia, unspecified: Secondary | ICD-10-CM | POA: Diagnosis not present

## 2020-05-20 DIAGNOSIS — Z993 Dependence on wheelchair: Secondary | ICD-10-CM | POA: Diagnosis not present

## 2020-06-20 DIAGNOSIS — Z993 Dependence on wheelchair: Secondary | ICD-10-CM | POA: Diagnosis not present

## 2020-06-20 DIAGNOSIS — G8220 Paraplegia, unspecified: Secondary | ICD-10-CM | POA: Diagnosis not present

## 2020-07-07 DIAGNOSIS — F321 Major depressive disorder, single episode, moderate: Secondary | ICD-10-CM | POA: Diagnosis not present

## 2020-07-07 DIAGNOSIS — G8220 Paraplegia, unspecified: Secondary | ICD-10-CM | POA: Diagnosis not present

## 2020-07-07 DIAGNOSIS — N318 Other neuromuscular dysfunction of bladder: Secondary | ICD-10-CM | POA: Diagnosis not present

## 2020-07-07 DIAGNOSIS — Z993 Dependence on wheelchair: Secondary | ICD-10-CM | POA: Diagnosis not present

## 2020-07-07 DIAGNOSIS — F4310 Post-traumatic stress disorder, unspecified: Secondary | ICD-10-CM | POA: Diagnosis not present

## 2020-07-18 DIAGNOSIS — Z993 Dependence on wheelchair: Secondary | ICD-10-CM | POA: Diagnosis not present

## 2020-07-18 DIAGNOSIS — G8220 Paraplegia, unspecified: Secondary | ICD-10-CM | POA: Diagnosis not present

## 2020-08-18 DIAGNOSIS — Z993 Dependence on wheelchair: Secondary | ICD-10-CM | POA: Diagnosis not present

## 2020-08-18 DIAGNOSIS — G8220 Paraplegia, unspecified: Secondary | ICD-10-CM | POA: Diagnosis not present

## 2023-12-05 NOTE — Progress Notes (Addendum)
 Patient ID:  James Richard is a 33 y.o. (DOB [birth year]) male.  Assessment and Plan   1. Medicare annual wellness visit, subsequent  Lipid Panel  2. Essential hypertension  CBC And Differential   Comprehensive Metabolic Panel   POCT A1C  3. Major depressive disorder with single episode, in full remission    4. PTSD (post-traumatic stress disorder)    5. Spastic neurogenic bladder    6. Lower paraplegia (*)    7. Conus medullaris syndrome (*)    8. Wheelchair dependent    9. Cigarette nicotine  dependence without complication    10. Alcohol use      See below for Medicare specific topics.  Physical exam did not demonstrate any abnormalities.  Routine annual fasting labs drawn today.  See result notes for communication to patient.  Return to clinic in one year for repeat physical examination with fasting labs. Patient's blood pressure was elevated today and patient reported a history of elevated blood pressure readings.  Diagnosis of hypertension has been made.  Will check a CBC, CMP, and urine chemistries today.  Recommended initiation of medication and patient agreed.  Prescribed olmesartan 20 mg daily.  Patient lives over an hour away and returning for a blood pressure check in 2 weeks would be difficult.  Patient instead indicated he would purchase a home blood pressure machine and would contact the clinic in 2 weeks with the results.  Will proceed with medication management remotely as feasible.  Follow-up in clinic in 6 months.  (3. and 4.)  Stable per patient as managed with therapy alone.  Continue without medication at this time.  Follow-up in 6 months, or sooner as needed. Patient is well-controlled on oxybutynin 10 mg daily.  Continued oxybutynin at current dose.  Follow-up in 6 months, or sooner as needed.   (6., 7., and 8.)  Stable per patient, although patient is in need of a replacement manual wheelchair due to his current wheelchair experiencing wear and tear.  See HPI below for  specific elements required for a replacement.  Replacement order faxed today. Patient indicated he currently smokes approximately 1/2 pack of cigarettes per day.  Patient indicated he was contemplating quitting and had Nicorette gum on hand, but stated he did not wish to discuss any other strategies or medications today.  Educated patient on the proper use of Nicorette gum.  Approximately 3 minutes were spent in discussion surrounding nicotine  cessation.  Follow-up in 6 months. Patient reported his current alcohol consumption was approximately 2-3 beers 2-3 times per week, for a total of approximately 9 beers maximum.  Instructed patient to be conscious of the amount he drinks and to try to limit as much as possible.  Patient stated understanding.  Follow-up in 6 months.   Risks, benefits, and alternatives of the medications and treatment plan prescribed today were discussed, and patient expressed understanding.    Patient's Medications       * Accurate as of December 05, 2023 11:59 PM. Reflects encounter med changes as of last refresh          New Prescriptions      Instructions  olmesartan 20 MG tablet Commonly known as: BENICAR Started by: Yvonna Gamble, PA  20 mg, Oral, Daily       Continued Medications      Instructions  Misc. Devices Misc  Dispense: One manual wheelchair with locking breaks. Sig: Use with ambulation.       Modified Medications  Instructions  oxybutynin 10 MG 24 hr tablet Commonly known as: DITROPAN-XL What changed: Another medication with the same name was removed. Continue taking this medication, and follow the directions you see here. Changed by: Yvonna Gamble, PA  10 mg       Discontinued Medications    escitalopram oxalate 20 mg tablet Commonly known as: LEXAPRO Stopped by: Brendan Monaghan, PA   traZODone 50 mg tablet Commonly known as: DESYREL Stopped by: Yvonna Gamble, PA   triamcinolone 0.5% cream Commonly known as:  ARISTOCORT,KENALOG Stopped by: Yvonna Gamble, PA        Orders Placed This Encounter  Procedures  . CBC And Differential  . Comprehensive Metabolic Panel  . Lipid Panel  . POCT A1C      Subjective   Patient ID:  James Richard is a 33 y.o. (DOB May 15, 1990) male    Patient presents with  . Medicare Wellness Visit    Needs fitted for wheelchair     HPI: Mr. Causer presents for a Medicare annual wellness visit and to complete an annual physical exam.  All histories were reviewed.  Mr. Beitler is not currently fasting but he lives over an hour away and   Mr. Huge has the following chronic diagnoses that are managed by this clinic: Hypertension, major depressive disorder, PTSD, spastic neurogenic bladder, lower paraplegia, conus medullaris syndrome, wheelchair dependence, nicotine  dependence, and alcohol use. - Associated medications listed in [facility] chart: Yes - Mr. Neiss confirms [facility] chart medications as accurate: Yes - Mr. Lothrop states compliance with all medications listed in [facility] chart: Yes - Mr. Lokey denies side effects on all medications listed in [facility] chart: Yes  Mr. Duval states he stopped his Lexapro and trazodone, and that his mental health has been well controlled with therapy alone.  Reported tobacco use: 1/2 pack per day  Reported alcohol use: 2-3 beers 2-3 times per week  Mr. Rasmusson also reports that he needs an in-person assessment for a replacement manual wheelchair.  His current chair has experienced the following wear and tear: old, metal areas rusted, seat is breaking, and leather is peeling.  He has provided documentation that the following elements of the assessment are required for a replacement:  Wheelchair Replacement Assessment Specific Required Elements 1. What is the diagnosis or medical condition that causes the mobility limitation? Lower paraplegia (G82.20) and conus medullaris syndrome (G95.81) leading to  being wheelchair dependent (Z99.3). 2. What activities of daily living (ADL) within the home are impacted (feeding, grooming, hygiene, etc.? Be specific. General ambulation is impaired, which affects the ability to engage in ADLs such as moving around the house, going to the toilet, and all other activities that require mobility. 3. What is the severity of the mobility limitation? Unable to complete the ADLs within a reasonable time frame. 4. Is a cane or walker able to adequately resolve the mobility limitation? No, a cane or walker is not sufficient. 5. Will a manual wheelchair significantly improve the patient's ability to perform ADLs? Yes, and this is demonstrated by patient already using a manual wheelchair as his predominate assistive device for ambulation, including at today's visit. 6. Is the patient willing to use the chair? Yes, and this is demonstrated by patient already using a manual wheelchair as his predominate assistive device for ambulation, including at today's visit. 7. Will the patient use the wheelchair in the home on a regular basis? Yes, and this is demonstrated by patient already using a manual  wheelchair as his predominate assistive device for ambulation, including at today's visit. 8. Does the patient have the mental and physical ability to safely self-propel the wheelchair? Yes, and this is demonstrated by patient already using a manual wheelchair as his predominate assistive device for ambulation, including at today's visit. 9. Which extremities (arms, legs, both) will hte patient be using to self-propel? What is the strength rating for these extremities? (ie: UE 4/5, LL 3/5)? Patient will utilize both upper extremities to self-propel. Strength rating at both the upper extremities today is 5/5. 10. Does the patient have a caregiver who is available, willing, and able to provide assistance with the wheelchair? This is unnecessary. Patient already uses a manual wheelchair as his  predominate assistive device for ambulation, including at today's visit, and is able to care for himself on his own. He does not require assistance from a caregiver.    Reviewed and updated this visit by provider: Tobacco  Allergies  Meds  Problems  Med Hx  Surg Hx  Fam Hx       ROS: Review of Systems  Constitutional:  Negative for chills, fever and unexpected weight change.  HENT:  Negative for congestion, hearing loss, postnasal drip, rhinorrhea, sinus pressure, sinus pain and sore throat.   Eyes:  Negative for visual disturbance.  Respiratory:  Negative for cough and shortness of breath.   Cardiovascular:  Negative for chest pain and palpitations.  Gastrointestinal:  Negative for abdominal pain, constipation, diarrhea, nausea and vomiting.  Endocrine: Negative for polydipsia and polyuria.  Genitourinary:  Negative for dysuria and hematuria.  Musculoskeletal:  Positive for gait problem (wheelchair dependent). Negative for arthralgias and myalgias.  Skin:  Negative for rash and wound.  Neurological:  Negative for dizziness, light-headedness and headaches.  Hematological:  Does not bruise/bleed easily.  Psychiatric/Behavioral:  Negative for confusion, decreased concentration, dysphoric mood, self-injury, sleep disturbance and suicidal ideas.      Objective   Vitals: BP (!) 166/100   Pulse 102   Temp 97.5 F (36.4 C) (Temporal)   SpO2 96%   Physical Exam: Physical Exam Constitutional:      General: He is not in acute distress.    Appearance: Normal appearance. He is well-developed.  HENT:     Head: Normocephalic and atraumatic.  Eyes:     Conjunctiva/sclera: Conjunctivae normal.     Pupils: Pupils are equal, round, and reactive to light.  Cardiovascular:     Rate and Rhythm: Normal rate and regular rhythm.     Heart sounds: S1 normal and S2 normal. Heart sounds not distant. No murmur heard.    No friction rub. No gallop. No S3 or S4 sounds.  Musculoskeletal:      Right shoulder: Normal range of motion. Normal strength.     Left shoulder: Normal range of motion. Normal strength.     Right elbow: Normal range of motion.     Left elbow: Normal range of motion.     Right wrist: Normal range of motion.     Left wrist: Normal range of motion.     Right hand: Normal range of motion. Normal strength.     Left hand: Normal range of motion. Normal strength.     Comments: Bilateral upper extremity strength is 5/5 at all joints and 5/5 for grip strength.  Pulmonary:     Effort: Pulmonary effort is normal.     Breath sounds: Normal breath sounds.  Abdominal:     General: Bowel sounds are normal.  There is no distension.     Palpations: Abdomen is soft. Abdomen is not rigid.     Tenderness: There is no abdominal tenderness. There is no guarding.  Skin:    General: Skin is cool and dry.  Neurological:     Mental Status: He is alert.     Sensory: Sensation is intact.     Motor: Weakness (bilateral lower extremities only) present.     Coordination: Coordination is intact.     Gait: Gait abnormal (wheelchair dependence).  Psychiatric:        Attention and Perception: Attention and perception normal.        Mood and Affect: Mood and affect normal.        Speech: Speech normal.        Behavior: Behavior normal.     Comments: All psychiatric inferred from conversation.    Labs: Medicare AWV on 12/05/2023  Component Date Value Ref Range Status  . Hemoglobin A1c 12/05/2023 5.1  4.8 - 5.6 % Final  . WBC 12/05/2023 10.2  5.1 - 10.8 thou/mcL Final  . RBC 12/05/2023 5.31  4.05 - 5.64 million/mcL Final  . HGB 12/05/2023 16.8  13.5 - 17.5 gm/dL Final  . HCT 91/95/7974 50.0  40.5 - 52.5 % Final  . MCV 12/05/2023 94.1  83.0 - 97.0 fL Final  . MCH 12/05/2023 31.7  28.0 - 33.0 pg Final  . MCHC 12/05/2023 33.7  32.0 - 36.0 gm/dL Final  . Plt Ct 91/95/7974 183  150 - 400 thou/mcL Final  . RDW CV 12/05/2023 14.3  11.7 - 15.2 % Final  . NEUTROPHIL % 12/05/2023 65.3  %  Final  . LYMPHOCYTE % 12/05/2023 27.2  % Final  . MID% 12/05/2023 7.5  % Final  . ABSOLUTE NEUTROPHIL COUNT 12/05/2023 6.60  1.50 - 7.50 thou/mcL Final  . ABSOLUTE LYMPHOCYTE COUNT 12/05/2023 2.70  1.00 - 4.50 thou/mcL Final  . ABSOLUTE MID 12/05/2023 0.9  0.1 - 1.5 thou/mcL Final  . Glucose 12/05/2023 82  70 - 99 mg/dL Final  . BUN 91/95/7974 15  6 - 20 mg/dL Final  . Creatinine 91/95/7974 1.03  0.76 - 1.27 mg/dL Final  . eGFR 91/95/7974 98  >59 mL/min/1.73 Final  . BUN/Creatinine Ratio 12/05/2023 15  9 - 20 Final  . Sodium 12/05/2023 139  134 - 144 mmol/L Final  . Potassium 12/05/2023 3.8  3.5 - 5.2 mmol/L Final  . Chloride 12/05/2023 101  96 - 106 mmol/L Final  . CO2 12/05/2023 22  20 - 29 mmol/L Final  . CALCIUM 12/05/2023 9.5  8.7 - 10.2 mg/dL Final  . Total Protein 12/05/2023 6.6  6.0 - 8.5 g/dL Final  . Albumin, Serum 12/05/2023 4.3  4.1 - 5.1 g/dL Final  . Globulin, Total 12/05/2023 2.3  1.5 - 4.5 g/dL Final  . Total Bilirubin 12/05/2023 0.6  0.0 - 1.2 mg/dL Final  . Alkaline Phosphatase 12/05/2023 105  44 - 121 IU/L Final  . AST 12/05/2023 28  0 - 40 IU/L Final  . ALT (SGPT) 12/05/2023 39  0 - 44 IU/L Final  . Cholesterol, Total 12/05/2023 141  100 - 199 mg/dL Final  . Triglycerides 12/05/2023 91  0 - 149 mg/dL Final  . HDL 91/95/7974 49  >39 mg/dL Final  . VLDL Cholesterol Cal 12/05/2023 17  5 - 40 mg/dL Final  . LDL 91/95/7974 75  0 - 99 mg/dL Final        Medicare AWV  Adine Che  is a 33 y.o. male who presents for his subsequent annual wellness visit for Medicare.  Clinical documentation was reviewed and is accessible via encounter-level attachments.    Any physical exam components or additional concerns beyond the scope of the Annual Wellness Visit may be documented in a separate note within this encounter.  Medicare Required Components     Reviewed and updated this visit by provider: Tobacco  Allergies  Meds  Problems  Med Hx  Surg Hx  Fam Hx        Substance Use Disorder Risk Statement: Low Substance Use Disorder / Overdose risk - Risk factors were reviewed and patient is low risk   Patient Care Team: Lauraine Patient, PA as PCP - General (Physician Assistant) Alm Taft Fragmin, MD (Urology)  Vitals   Vitals:   12/05/23 1404 12/05/23 1420  BP: (!) 168/100 (!) 166/100  Patient Position: Sitting   Pulse: 102   Temp: 97.5 F (36.4 C)   TempSrc: Temporal   Height: Comment: wheelchair   Weight: Comment: wheelchair   SpO2: 96%     Disposition   1. Medicare annual wellness visit, subsequent (Primary) -     Lipid Panel; Future 2. Essential hypertension -     CBC And Differential; Future; Expected date: 12/12/2023 -     Comprehensive Metabolic Panel; Future; Expected date: 12/12/2023 -     POCT A1C; Future 3. Major depressive disorder with single episode, in full remission 4. PTSD (post-traumatic stress disorder) 5. Spastic neurogenic bladder 6. Lower paraplegia (*) -     Misc. Devices MISC; Dispense: One manual wheelchair with locking breaks. Sig: Use with ambulation., Print 7. Conus medullaris syndrome (*) -     Misc. Devices MISC; Dispense: One manual wheelchair with locking breaks. Sig: Use with ambulation., Print 8. Wheelchair dependent -     Misc. Devices MISC; Dispense: One manual wheelchair with locking breaks. Sig: Use with ambulation., Print 9. Cigarette nicotine  dependence without complication 10. Alcohol use Other orders -     olmesartan (BENICAR) 20 MG tablet; Take one tablet (20 mg dose) by mouth daily., Starting Mon 12/05/2023, Until Sat 06/02/2024, Normal   Follow up in about 6 months (around 06/06/2024) for Hypertension return, with same day fasting labs.   Health maintenance issues were discussed with the patient.  A written plan was provided to the patient in the form of patient instructions in the After Visit Summary document.     *Some images could not be shown.

## 2024-01-11 ENCOUNTER — Ambulatory Visit: Payer: Self-pay | Admitting: Orthopaedic Surgery

## 2024-02-27 ENCOUNTER — Encounter (HOSPITAL_COMMUNITY): Payer: Self-pay | Admitting: Internal Medicine

## 2024-02-27 ENCOUNTER — Observation Stay (HOSPITAL_COMMUNITY)
Admission: EM | Admit: 2024-02-27 | Discharge: 2024-02-29 | Disposition: A | Discharge status: Home / Self Care | Attending: Gastroenterology | Admitting: Gastroenterology

## 2024-02-27 ENCOUNTER — Inpatient Hospital Stay (HOSPITAL_COMMUNITY)

## 2024-02-27 DIAGNOSIS — F1020 Alcohol dependence, uncomplicated: Secondary | ICD-10-CM | POA: Insufficient documentation

## 2024-02-27 DIAGNOSIS — N319 Neuromuscular dysfunction of bladder, unspecified: Secondary | ICD-10-CM | POA: Diagnosis present

## 2024-02-27 DIAGNOSIS — F1090 Alcohol use, unspecified, uncomplicated: Secondary | ICD-10-CM | POA: Diagnosis present

## 2024-02-27 DIAGNOSIS — Z23 Encounter for immunization: Secondary | ICD-10-CM | POA: Diagnosis not present

## 2024-02-27 DIAGNOSIS — G8220 Paraplegia, unspecified: Secondary | ICD-10-CM | POA: Diagnosis not present

## 2024-02-27 DIAGNOSIS — S91002A Unspecified open wound, left ankle, initial encounter: Secondary | ICD-10-CM | POA: Insufficient documentation

## 2024-02-27 DIAGNOSIS — R1011 Right upper quadrant pain: Secondary | ICD-10-CM | POA: Diagnosis present

## 2024-02-27 DIAGNOSIS — F17210 Nicotine dependence, cigarettes, uncomplicated: Secondary | ICD-10-CM | POA: Insufficient documentation

## 2024-02-27 DIAGNOSIS — K2970 Gastritis, unspecified, without bleeding: Secondary | ICD-10-CM | POA: Diagnosis not present

## 2024-02-27 DIAGNOSIS — I10 Essential (primary) hypertension: Secondary | ICD-10-CM | POA: Diagnosis present

## 2024-02-27 DIAGNOSIS — K449 Diaphragmatic hernia without obstruction or gangrene: Secondary | ICD-10-CM | POA: Insufficient documentation

## 2024-02-27 DIAGNOSIS — Z79899 Other long term (current) drug therapy: Secondary | ICD-10-CM | POA: Insufficient documentation

## 2024-02-27 DIAGNOSIS — X58XXXA Exposure to other specified factors, initial encounter: Secondary | ICD-10-CM | POA: Insufficient documentation

## 2024-02-27 DIAGNOSIS — F322 Major depressive disorder, single episode, severe without psychotic features: Secondary | ICD-10-CM | POA: Insufficient documentation

## 2024-02-27 DIAGNOSIS — K2100 Gastro-esophageal reflux disease with esophagitis, without bleeding: Secondary | ICD-10-CM | POA: Diagnosis not present

## 2024-02-27 DIAGNOSIS — F1010 Alcohol abuse, uncomplicated: Secondary | ICD-10-CM

## 2024-02-27 DIAGNOSIS — F329 Major depressive disorder, single episode, unspecified: Secondary | ICD-10-CM | POA: Diagnosis present

## 2024-02-27 DIAGNOSIS — K920 Hematemesis: Principal | ICD-10-CM

## 2024-02-27 DIAGNOSIS — Z8659 Personal history of other mental and behavioral disorders: Secondary | ICD-10-CM

## 2024-02-27 LAB — COMPREHENSIVE METABOLIC PANEL WITH GFR
ALT: 29 U/L (ref 0–44)
AST: 21 U/L (ref 15–41)
Albumin: 4.5 g/dL (ref 3.5–5.0)
Alkaline Phosphatase: 116 U/L (ref 38–126)
Anion gap: 11 (ref 5–15)
BUN: 14 mg/dL (ref 6–20)
CO2: 31 mmol/L (ref 22–32)
Calcium: 10.1 mg/dL (ref 8.9–10.3)
Chloride: 99 mmol/L (ref 98–111)
Creatinine, Ser: 1.05 mg/dL (ref 0.61–1.24)
GFR, Estimated: >60 mL/min (ref >60)
Glucose, Bld: 85 mg/dL (ref 70–99)
Potassium: 3.8 mmol/L (ref 3.5–5.1)
Sodium: 140 mmol/L (ref 135–145)
Total Bilirubin: 1 mg/dL (ref 0.0–1.2)
Total Protein: 7.5 g/dL (ref 6.5–8.1)

## 2024-02-27 LAB — LIPASE, BLOOD: Lipase: 20 U/L (ref 11–51)

## 2024-02-27 LAB — CBC
HCT: 55.1 % — ABNORMAL HIGH (ref 39.0–52.0)
Hemoglobin: 18.2 g/dL — ABNORMAL HIGH (ref 13.0–17.0)
MCH: 34.5 pg — ABNORMAL HIGH (ref 26.0–34.0)
MCHC: 33 g/dL (ref 30.0–36.0)
MCV: 104.6 fL — ABNORMAL HIGH (ref 80.0–100.0)
Platelets: 204 K/uL (ref 150–400)
RBC: 5.27 MIL/uL (ref 4.22–5.81)
RDW: 18.2 % — ABNORMAL HIGH (ref 11.5–15.5)
WBC: 10.1 K/uL (ref 4.0–10.5)
nRBC: 0 % (ref 0.0–0.2)

## 2024-02-27 LAB — TYPE AND SCREEN
ABO/RH(D): O POS
Antibody Screen: NEGATIVE

## 2024-02-27 LAB — ABO/RH: ABO/RH(D): O POS

## 2024-02-27 MED ORDER — ACETAMINOPHEN 325 MG PO TABS
650.0000 mg | ORAL_TABLET | Freq: Four times a day (QID) | ORAL | Status: DC | PRN
Start: 2024-02-27 — End: 2024-02-29

## 2024-02-27 MED ORDER — THIAMINE HCL 100 MG/ML IJ SOLN
100.0000 mg | Freq: Every day | INTRAMUSCULAR | Status: DC
  Administered 2024-02-28 – 2024-02-29 (×3): 100 mg via INTRAVENOUS
  Filled 2024-02-27 (×3): qty 2

## 2024-02-27 MED ORDER — IRBESARTAN 75 MG PO TABS: 37.5000 mg | ORAL_TABLET | Freq: Every day | ORAL | Status: DC

## 2024-02-27 MED ORDER — PANTOPRAZOLE SODIUM 40 MG IV SOLR
40.0000 mg | Freq: Two times a day (BID) | INTRAVENOUS | Status: DC
  Administered 2024-02-28 – 2024-02-29 (×3): 40 mg via INTRAVENOUS
  Filled 2024-02-27 (×3): qty 10

## 2024-02-27 MED ORDER — SODIUM CHLORIDE 0.9% FLUSH: 3.0000 mL | INTRAVENOUS | Status: DC | PRN

## 2024-02-27 MED ORDER — ONDANSETRON HCL 4 MG/2ML IJ SOLN: 4.0000 mg | Freq: Four times a day (QID) | INTRAMUSCULAR | Status: DC | PRN

## 2024-02-27 MED ORDER — LABETALOL HCL 5 MG/ML IV SOLN: 5.0000 mg | INTRAVENOUS | Status: DC | PRN

## 2024-02-27 MED ORDER — LACTATED RINGERS IV SOLN: INTRAVENOUS | Status: DC

## 2024-02-27 MED ORDER — OCTREOTIDE LOAD VIA INFUSION
50.0000 ug | Freq: Once | INTRAVENOUS | Status: AC
  Administered 2024-02-27: 50 ug via INTRAVENOUS
  Filled 2024-02-27: qty 25

## 2024-02-27 MED ORDER — SODIUM CHLORIDE 0.9% FLUSH
3.0000 mL | Freq: Two times a day (BID) | INTRAVENOUS | Status: DC
  Administered 2024-02-27 – 2024-02-29 (×3): 3 mL via INTRAVENOUS

## 2024-02-27 MED ORDER — ONDANSETRON 4 MG PO TBDP
4.0000 mg | ORAL_TABLET | Freq: Once | ORAL | Status: DC
  Filled 2024-02-27: qty 1

## 2024-02-27 MED ORDER — LORAZEPAM 2 MG/ML IJ SOLN: 1.0000 mg | INTRAMUSCULAR | Status: DC | PRN

## 2024-02-27 MED ORDER — SODIUM CHLORIDE 0.9 % IV SOLN
50.0000 ug/h | INTRAVENOUS | Status: DC
  Administered 2024-02-27: 50 ug/h via INTRAVENOUS
  Filled 2024-02-27 (×2): qty 1

## 2024-02-27 MED ORDER — SODIUM CHLORIDE 0.9 % IV SOLN: 250.0000 mL | INTRAVENOUS | Status: AC | PRN

## 2024-02-27 MED ORDER — IRBESARTAN 75 MG PO TABS
37.5000 mg | ORAL_TABLET | Freq: Every day | ORAL | Status: DC
  Administered 2024-02-28 – 2024-02-29 (×3): 37.5 mg via ORAL
  Filled 2024-02-27: qty 0.5
  Filled 2024-02-27: qty 1
  Filled 2024-02-27: qty 0.5

## 2024-02-27 MED ORDER — PANTOPRAZOLE SODIUM 40 MG IV SOLR
80.0000 mg | Freq: Once | INTRAVENOUS | Status: AC
  Administered 2024-02-27: 80 mg via INTRAVENOUS
  Filled 2024-02-27: qty 20

## 2024-02-27 MED ORDER — ADULT MULTIVITAMIN W/MINERALS CH
1.0000 | ORAL_TABLET | Freq: Every day | ORAL | Status: DC
  Administered 2024-02-28: 1 via ORAL
  Filled 2024-02-27: qty 1

## 2024-02-27 MED ORDER — SODIUM CHLORIDE 0.9% FLUSH
3.0000 mL | Freq: Two times a day (BID) | INTRAVENOUS | Status: DC
  Administered 2024-02-27 – 2024-02-28 (×2): 3 mL via INTRAVENOUS

## 2024-02-27 MED ORDER — THIAMINE MONONITRATE 100 MG PO TABS: 100.0000 mg | ORAL_TABLET | Freq: Every day | ORAL | Status: DC

## 2024-02-27 MED ORDER — THIAMINE HCL 100 MG/ML IJ SOLN: 100.0000 mg | Freq: Every day | INTRAMUSCULAR | Status: DC

## 2024-02-27 MED ORDER — FOLIC ACID 5 MG/ML IJ SOLN
1.0000 mg | Freq: Every day | INTRAMUSCULAR | Status: DC
  Administered 2024-02-28: 1 mg via INTRAVENOUS
  Filled 2024-02-27 (×2): qty 0.2

## 2024-02-27 MED ORDER — FOLIC ACID 1 MG PO TABS: 1.0000 mg | ORAL_TABLET | Freq: Every day | ORAL | Status: DC

## 2024-02-27 MED ORDER — NICOTINE 14 MG/24HR TD PT24
14.0000 mg | MEDICATED_PATCH | Freq: Every day | TRANSDERMAL | Status: DC
  Administered 2024-02-28: 14 mg via TRANSDERMAL
  Filled 2024-02-27 (×2): qty 1

## 2024-02-27 MED ORDER — IOHEXOL 350 MG/ML SOLN
100.0000 mL | Freq: Once | INTRAVENOUS | Status: AC
  Administered 2024-02-27: 100 mL via INTRAVENOUS

## 2024-02-27 MED ORDER — LACTATED RINGERS IV BOLUS
1000.0000 mL | Freq: Once | INTRAVENOUS | Status: AC
  Administered 2024-02-27: 1000 mL via INTRAVENOUS

## 2024-02-27 MED ORDER — ACETAMINOPHEN 650 MG RE SUPP: 650.0000 mg | Freq: Four times a day (QID) | RECTAL | Status: DC | PRN

## 2024-02-27 MED ORDER — ONDANSETRON HCL 4 MG PO TABS: 4.0000 mg | ORAL_TABLET | Freq: Four times a day (QID) | ORAL | Status: DC | PRN

## 2024-02-27 MED ORDER — LORAZEPAM 1 MG PO TABS: 1.0000 mg | ORAL_TABLET | ORAL | Status: DC | PRN
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
## 2024-02-27 NOTE — ED Provider Notes (Signed)
 [place] EMERGENCY DEPARTMENT AT [facility] Provider Note   CSN: [id number] Arrival date & time: 02/27/24  8162     Patient presents with: Hematemesis   Skip Litke is a 33 y.o. male.   33 year old male with past medical history of paraplegia as well as alcohol abuse presenting to the emergency department today with hematemesis.  Patient states that he had an episode of vomiting this morning.  Reports that he saw some blood streaking the first time he vomited and then noted multiple separate occurrences where he noticed moderate volume hematemesis.  The patient states that he has been having some lightheadedness as the day has gone on.  He states that he has been having some intermittent right upper quadrant abdominal pain.  He came to the emergency department today due to these symptoms.  He denies any blood in his stool or dark stools.  Reports that he has been drinking between a half of 1/5 of liquor to 1/5 of liquor daily now for the past 5 years.        Prior to Admission medications   Medication Sig Start Date End Date Taking? Authorizing Provider  Elastic Bandages & Supports (ANKLE BRACE ADJUST-TO-FIT) MISC 2 Devices by Does not apply route as needed. 12/03/13   Hess, Dorise SAUNDERS, DO  hydrochlorothiazide  (HYDRODIURIL ) 12.5 MG tablet Take 1 tablet (12.5 mg total) by mouth daily. 04/08/17   Prentiss Dorothyann Maxwell, MD  ibuprofen  (ADVIL ,MOTRIN ) 600 MG tablet Take 1 tablet (600 mg total) by mouth every 6 (six) hours as needed. 08/05/16   Odis Burnard Jansky, PA-C  solifenacin (VESICARE) 10 MG tablet Take 1 tablet by mouth daily. 08/05/15   [provider]    Allergies: Patient has no known allergies.    Review of Systems  Gastrointestinal:  Positive for abdominal pain and vomiting.  All other systems reviewed and are negative.   Updated Vital Signs BP (!) 167/111 (BP Location: Right Arm)   Pulse 94   Temp 98.5 F (36.9 C) (Oral)   Resp 18   SpO2 96%    Physical Exam Vitals and nursing note reviewed.   Gen: NAD Eyes: PERRL, EOMI HEENT: no oropharyngeal swelling Neck: trachea midline Resp: clear to auscultation bilaterally Card: RRR, no murmurs, rubs, or gallops Abd: nontender, nondistended Extremities: no calf tenderness, no edema Vascular: 2+ radial pulses bilaterally, 2+ DP pulses bilaterally Skin: no rashes Psyc: acting appropriately   (all labs ordered are listed, but only abnormal results are displayed) Labs Reviewed  CBC - Abnormal; Notable for the following components:      Result Value   Hemoglobin 18.2 (*)    HCT 55.1 (*)    MCV 104.6 (*)    MCH 34.5 (*)    RDW 18.2 (*)    All other components within normal limits  LIPASE, BLOOD  COMPREHENSIVE METABOLIC PANEL WITH GFR  URINALYSIS, ROUTINE W REFLEX MICROSCOPIC  TYPE AND SCREEN  ABO/RH    EKG: None  Radiology: No results found.   Procedures   Medications Ordered in the ED  ondansetron  (ZOFRAN -ODT) disintegrating tablet 4 mg (4 mg Oral Patient Refused/Not Given 02/27/24 2227)  octreotide (SANDOSTATIN) 2 mcg/mL load via infusion 50 mcg (50 mcg Intravenous Bolus from Bag 02/27/24 2141)    And  octreotide (SANDOSTATIN) 500 mcg in sodium chloride  0.9 % 250 mL (2 mcg/mL) infusion (50 mcg/hr Intravenous New Bag/Given 02/27/24 2145)  lactated ringers  bolus 1,000 mL (1,000 mLs Intravenous New Bag/Given 02/27/24 2100)  pantoprazole  (  PROTONIX ) injection 80 mg (80 mg Intravenous Given 02/27/24 2050)                                    Medical Decision Making 33 year old male with past medical history of paraplegia and alcohol abuse presenting to the emergency department today with hematemesis.  This been ongoing throughout the day today.  I will further evaluate the patient here with basic labs as well as a type and screen.  I will cover the patient with octreotide and given Protonix  here.  Given his heavy alcohol abuse I will have the patient admitted for GI  consultation as he is tachycardic here on arrival.  Blood pressure here place in the beginning remained stable.  The patient's blood pressure remained elevated.  Heart rate improved with IV fluids.  His initial hemoglobin is actually elevated here.  Given his multiple episodes of hematemesis today with his drinking history I do think that he should be further observed here in the hospital.  Calls placed to hospitalist service.  Also a call was placed to GI to make them aware.  Amount and/or Complexity of Data Reviewed Labs: ordered.  Risk Prescription drug management. Decision regarding hospitalization.        Final diagnoses:  Hematemesis, unspecified whether nausea present  Alcohol abuse    ED Discharge Orders     None          Ula Prentice SAUNDERS, MD 02/27/24 2324

## 2024-02-27 NOTE — ED Triage Notes (Signed)
 Pt reports that he is a heavy drinker and usually has emesis first thing in the morning. Pt states that today he has continued to have emesis throughout the day and several have been bloody. Pt endorses RUQ abd pain intermittently.

## 2024-02-27 NOTE — ED Provider Triage Note (Signed)
 Emergency Medicine Provider Triage Evaluation Note  James Richard , a 33 y.o. male  was evaluated in triage.  Pt complains of hematemesis.  Patient reports significant alcohol use daily for the last 3 years and has experienced vomiting today that he initially reported was blood-tinged but did also endorse some brown-colored vomit.  Denies any significant abdominal pain.  No reported diarrhea.  Review of Systems  Positive: As above Negative: As above  Physical Exam  BP (!) 163/117 (BP Location: Left Arm)   Pulse (!) 132   Temp 97.8 F (36.6 C) (Oral)   Resp 16   SpO2 99%  Gen:   Awake, no distress  Resp:  Normal effort  MSK:   Moves extremities without difficulty  Other:    Medical Decision Making  Medically screening exam initiated at 7:39 PM.  Appropriate orders placed.  James Richard was informed that the remainder of the evaluation will be completed by another provider, this initial triage assessment does not replace that evaluation, and the importance of remaining in the ED until their evaluation is complete.     Thereasa Iannello A, PA-C 02/27/24 1940

## 2024-02-27 NOTE — ED Notes (Signed)
 Patient went to CT

## 2024-02-27 NOTE — ED Notes (Signed)
 Walked into patients room due to IV pump beeping. Found the octreotide got disconnected and was running into the floor. Unknown how long has been unattached from IV.

## 2024-02-28 ENCOUNTER — Encounter (HOSPITAL_COMMUNITY): Payer: Self-pay | Admitting: Internal Medicine

## 2024-02-28 ENCOUNTER — Other Ambulatory Visit: Payer: Self-pay

## 2024-02-28 DIAGNOSIS — K920 Hematemesis: Secondary | ICD-10-CM | POA: Diagnosis not present

## 2024-02-28 LAB — HEMOGLOBIN A1C
Hgb A1c MFr Bld: 4.5 % — ABNORMAL LOW (ref 4.8–5.6)
Mean Plasma Glucose: 82.45 mg/dL

## 2024-02-28 LAB — CBC
HCT: 49 % (ref 39.0–52.0)
HCT: 50.9 % (ref 39.0–52.0)
Hemoglobin: 16 g/dL (ref 13.0–17.0)
Hemoglobin: 16 g/dL (ref 13.0–17.0)
MCH: 34.3 pg — ABNORMAL HIGH (ref 26.0–34.0)
MCH: 33.8 pg (ref 26.0–34.0)
MCHC: 32.7 g/dL (ref 30.0–36.0)
MCHC: 31.4 g/dL (ref 30.0–36.0)
MCV: 104.9 fL — ABNORMAL HIGH (ref 80.0–100.0)
MCV: 107.6 fL — ABNORMAL HIGH (ref 80.0–100.0)
Platelets: 178 K/uL (ref 150–400)
Platelets: 179 K/uL (ref 150–400)
RBC: 4.67 MIL/uL (ref 4.22–5.81)
RBC: 4.73 MIL/uL (ref 4.22–5.81)
RDW: 17.6 % — ABNORMAL HIGH (ref 11.5–15.5)
RDW: 17.2 % — ABNORMAL HIGH (ref 11.5–15.5)
WBC: 7.7 K/uL (ref 4.0–10.5)
WBC: 7.4 K/uL (ref 4.0–10.5)
nRBC: 0 % (ref 0.0–0.2)
nRBC: 0 % (ref 0.0–0.2)

## 2024-02-28 LAB — COMPREHENSIVE METABOLIC PANEL WITH GFR
ALT: 21 U/L (ref 0–44)
AST: 17 U/L (ref 15–41)
Albumin: 3.7 g/dL (ref 3.5–5.0)
Alkaline Phosphatase: 91 U/L (ref 38–126)
Anion gap: 8 (ref 5–15)
BUN: 13 mg/dL (ref 6–20)
CO2: 27 mmol/L (ref 22–32)
Calcium: 9.2 mg/dL (ref 8.9–10.3)
Chloride: 102 mmol/L (ref 98–111)
Creatinine, Ser: 1.06 mg/dL (ref 0.61–1.24)
GFR, Estimated: >60 mL/min (ref >60)
Glucose, Bld: 176 mg/dL — ABNORMAL HIGH (ref 70–99)
Potassium: 4.2 mmol/L (ref 3.5–5.1)
Sodium: 137 mmol/L (ref 135–145)
Total Bilirubin: 1.2 mg/dL (ref 0.0–1.2)
Total Protein: 5.9 g/dL — ABNORMAL LOW (ref 6.5–8.1)

## 2024-02-28 LAB — HIV ANTIBODY (ROUTINE TESTING W REFLEX): HIV Screen 4th Generation wRfx: NONREACTIVE

## 2024-02-28 MED ORDER — INFLUENZA VIRUS VACC SPLIT PF (FLUZONE) 0.5 ML IM SUSY
0.5000 mL | PREFILLED_SYRINGE | INTRAMUSCULAR | Status: AC
  Administered 2024-02-29: 0.5 mL via INTRAMUSCULAR

## 2024-02-28 MED ORDER — FOLIC ACID 1 MG PO TABS
1.0000 mg | ORAL_TABLET | Freq: Every day | ORAL | Status: DC
  Administered 2024-02-28: 1 mg via ORAL
  Filled 2024-02-28: qty 1

## 2024-02-28 MED ORDER — LACTATED RINGERS IV SOLN: INTRAVENOUS | Status: DC

## 2024-02-28 NOTE — Consult Note (Addendum)
 WOC Nurse Consult Note: Reason for Consult: wounds  Wound type: 1  Full thickness R foot unknown etiology ? Trauma vs other red moist  2.  L ankle full thickness atypical appearing hyperkeratotic tissue with a dark center ? Etiology  3.  L leg healing full thickness likely trauma pink dry with some dry hemorrhagic tissue superior aspect  Pressure Injury POA: NA  Measurement: see nursing flowsheet  Wound bed: as above  Drainage (amount, consistency, odor) see nursing flowsheet  Periwound: intact  Dressing procedure/placement/frequency:  Cleanse R foot wound with Vashe, apply silver hydrofiber Soila [phone]) cut to fit wound bed every other day and secure with silicone foam.  Cleanse L ankle and L leg wounds with Vashe, apply Xeroform gauze (Lawson [id number]) to wound beds every other day  and secure with silicone foam or Kerlix roll gauze whichever is preferred.  POC discussed with bedside nurse. WOC team will not follow. Re-consult if further needs arise.   Thank you,    Powell Bar MSN, RN-BC, [employer]

## 2024-02-28 NOTE — ED Notes (Signed)
 Note sent to pharmacy still waiting on folic acid 1mg 
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
## 2024-02-28 NOTE — Care Management Obs Status (Cosign Needed)
 MEDICARE OBSERVATION STATUS NOTIFICATION   Patient Details  Name: Tauheed Mcfayden MRN: [id number] Date of Birth: 02-Dec-1990   Medicare Observation Status Notification Given:  Yes    Nena LITTIE Coffee, RN 02/28/2024, 2:24 PM

## 2024-02-28 NOTE — ED Notes (Addendum)
 Per Dr. Rosalie pt may have clear liquid fluids until midnight.
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
## 2024-02-28 NOTE — Plan of Care (Signed)

## 2024-02-28 NOTE — Progress Notes (Signed)
 Magold, MD notified okay to keep octreotide gtt off since no further issues and it has been off since this morning

## 2024-02-28 NOTE — TOC CM/SW Note (Signed)
 MOON notice reviewed c/pt via phone. Pt verbalized understanding. Notice was printed by staff at [facility] and delivered to bedside.

## 2024-02-28 NOTE — Care Management CC44 (Signed)
 Condition Code 44 Documentation Completed  Patient Details  Name: Duell Holdren MRN: [id number] Date of Birth: 1990/07/23   Condition Code 44 given:  Yes Patient signature on Condition Code 44 notice:  Yes Documentation of 2 MD's agreement:  Yes Code 44 added to claim:  Yes    Nena LITTIE Coffee, RN 02/28/2024, 2:24 PM

## 2024-02-28 NOTE — Progress Notes (Addendum)
 Progress Note   Patient: James Richard FMW:[id number] DOB: 07/21/1990 DOA: 02/27/2024     1 DOS: the patient was seen and examined on 02/28/2024    Assessment and Plan:    33 y/o M with hx ETOH use disorder presented to the ED for evaluation of 2 episodes of hematemesis at home.  Hgb stable.    Hematemesis -hemodynamically stable. Hgb stable. No further episodes of emesis. No hx GI bleeding, varices, or prior endoscopies. CT abdomen GI bleed study no evidence of active bleeding. - Continue IV Protonix  40 mg BID. Octreotide gtt ordered but was never started, ok to leave off given stability today.  -CLD - GI consult appreciated  - EGD tomorrow am.  -Continue to trend hgb.  - mIVF    History of chronic alcohol use Alcohol use disorder - No evidence of withdrawal thus far.  - Continue CIWA protocol, Ativan  as needed, multivitamin, folic acid and thiamine . - He is interested in cessation, seeing therapy currently.   Major depressive disorder History of PTSD -Currently patient is not taking Lexapro anymore. He is seeing a therapist. No SI/HI   Essential hypertension - At home patient is on Omelestran 20 mg daily.  Continue Avapro 37.5 mg while in the hospital. - Pending pharmacy verification of home medications   Bilateral lower extremity  paraplegia Neurogenic bladder Wheelchair-bound Continue In-N-Out catheter as needed.   Continuous dependence on smoking -Continue nicotine  patch.  Counsel provided for smoking cessation  Hyperglycemia  F/u A1c    DVT prophylaxis:  SCDs Code Status:  Full Code Diet: NPO. Family Communication:   Family was present at bedside, at the time of interview. Opportunity was given to ask question and all questions were answered satisfactorily.  Disposition Plan: Continue to monitor H&H Consults: Gastroenterology Admission status:   Inpatient, Step Down Unit     Subjective: No further episodes of bloody emesis, nausea, or abdominal pain.  Reports increased reflux lately.   No bloody stools or hx endoscopy. No evidence of ETOH withdrawal thus far.   Physical Exam: Vitals:   02/28/24 0415 02/28/24 0722 02/28/24 0724 02/28/24 0738  BP: 121/76 135/85  (!) 130/92  Pulse: 89 80  83  Resp: 16 16    Temp: 97.9 F (36.6 C) 98.5 F (36.9 C)    TempSrc: Oral Oral    SpO2: 96% 97% 97%    Physical Exam Vitals reviewed.  Constitutional:      General: He is not in acute distress.    Appearance: He is not ill-appearing.  HENT:     Head: Normocephalic and atraumatic.     Mouth/Throat:     Mouth: Mucous membranes are moist.     Pharynx: Oropharynx is clear.  Eyes:     Extraocular Movements: Extraocular movements intact.     Pupils: Pupils are equal, round, and reactive to light.  Cardiovascular:     Rate and Rhythm: Normal rate.  Pulmonary:     Effort: Pulmonary effort is normal. No respiratory distress.     Breath sounds: Normal breath sounds.  Abdominal:     General: Abdomen is flat. There is no distension.     Palpations: Abdomen is soft.     Tenderness: There is no abdominal tenderness.  Musculoskeletal:     Cervical back: Normal range of motion and neck supple.     Right lower leg: No edema.     Left lower leg: No edema.  Skin:    General: Skin is warm and  dry.     Coloration: Skin is not pale.  Neurological:     Mental Status: He is alert. Mental status is at baseline.  Psychiatric:        Mood and Affect: Mood normal.        Behavior: Behavior normal.        Thought Content: Thought content normal.        Judgment: Judgment normal.     Data Reviewed:   Labs on Admission: I have personally reviewed following labs and imaging studies  CBC: Recent Labs  Lab 02/27/24 2048 02/28/24 0433  WBC 10.1 7.7  HGB 18.2* 16.0  HCT 55.1* 49.0  MCV 104.6* 104.9*  PLT 204 178   Basic Metabolic Panel: Recent Labs  Lab 02/27/24 2048 02/28/24 0432  NA 140 137  K 3.8 4.2  CL 99 102  CO2 31 27  GLUCOSE 85  176*  BUN 14 13  CREATININE 1.05 1.06  CALCIUM 10.1 9.2   GFR: CrCl cannot be calculated (Unknown ideal weight.). Liver Function Tests: Recent Labs  Lab 02/27/24 2048 02/28/24 0432  AST 21 17  ALT 29 21  ALKPHOS 116 91  BILITOT 1.0 1.2  PROT 7.5 5.9*  ALBUMIN 4.5 3.7   Recent Labs  Lab 02/27/24 2048  LIPASE 20   No results for input(s): AMMONIA in the last 168 hours. Coagulation Profile: No results for input(s): INR, PROTIME in the last 168 hours. Cardiac Enzymes: No results for input(s): CKTOTAL, CKMB, CKMBINDEX, TROPONINI in the last 168 hours. BNP (last 3 results) No results for input(s): PROBNP in the last 8760 hours. HbA1C: No results for input(s): HGBA1C in the last 72 hours. CBG: No results for input(s): GLUCAP in the last 168 hours. Lipid Profile: No results for input(s): CHOL, HDL, LDLCALC, TRIG, CHOLHDL, LDLDIRECT in the last 72 hours. Thyroid Function Tests: No results for input(s): TSH, T4TOTAL, FREET4, T3FREE, THYROIDAB in the last 72 hours. Anemia Panel: No results for input(s): VITAMINB12, FOLATE, FERRITIN, TIBC, IRON, RETICCTPCT in the last 72 hours.   Radiological Exams on Admission: CT ANGIO GI BLEED Result Date: 02/28/2024 EXAM: CTA ABDOMEN AND PELVIS WITH CONTRAST 02/28/2024 12:04:34 AM TECHNIQUE: CTA images of the abdomen and pelvis with intravenous contrast. Three-dimensional MIP/volume rendered formations were performed. Automated exposure control, iterative reconstruction, and/or weight based adjustment of the mA/kV was utilized to reduce the radiation dose to as low as reasonably achievable. COMPARISON: CT abdomen and pelvis 02/23/2015. CLINICAL HISTORY: Hematemesis. Patient reports that he is a heavy drinker and usually has emesis first thing in the morning. Patient states that today he has continued to have emesis throughout the day and several have been bloody. Patient endorses RUQ abd  pain intermittently. FINDINGS: VASCULATURE: No acute finding. No occlusion or significant stenosis. GI BLEED: No active extravasation of contrast within the GI tract. AORTA: No acute finding. No abdominal aortic aneurysm. No dissection. CELIAC TRUNK: No acute finding. No occlusion or significant stenosis. SUPERIOR MESENTERIC ARTERY: No acute finding. No occlusion or significant stenosis. INFERIOR MESENTERIC ARTERY: No acute finding. No occlusion or significant stenosis. RENAL ARTERIES: Duplicated right renal artery present. No occlusion or significant stenosis. ILIAC ARTERIES: No acute finding. No occlusion or significant stenosis. ABDOMEN/PELVIS: No acute finding. LOWER CHEST: Visualized portion of the lower chest demonstrates no acute abnormality. LIVER: The liver is unremarkable. GALLBLADDER AND BILE DUCTS: Gallbladder is unremarkable. No biliary ductal dilatation. SPLEEN: The spleen is unremarkable. PANCREAS: The pancreas is unremarkable. ADRENAL GLANDS: Bilateral adrenal glands demonstrate  no acute abnormality. KIDNEYS, URETERS AND BLADDER: There is left renal atrophy and scarring. Left renal cysts are present measuring up to 3 cm. There is diffuse bladder wall thickening. No stones in the kidneys or ureters. No hydronephrosis. No perinephric or periureteral stranding. GI AND BOWEL: Stomach and duodenal sweep demonstrate no acute abnormality. There is sigmoid colon diverticulosis. The appendix is surgically absent. There is no bowel obstruction. No abnormal bowel wall thickening or distension. REPRODUCTIVE: Reproductive organs are unremarkable. PERITONEUM AND RETROPERITONEUM: No ascites or free air. LYMPH NODES: Mildly enlarged left inguinal lymph nodes measuring up to 1 cm. BONES AND SOFT TISSUES: No acute abnormality of the bones. No acute soft tissue abnormality. IMPRESSION: 1. No active gastrointestinal bleeding. 2. No occlusion or hemodynamically significant stenosis of the abdominal or pelvic arterial  system. No abdominal aortic aneurysm. No aortic dissection. 3. Duplicated right renal artery. 4. Left renal atrophy and scarring with left renal cysts up to 3 cm. 5. Diffuse bladder wall thickening; correlate clinically for cystitis or outlet obstruction. 6. Sigmoid diverticulosis without evidence of diverticulitis. 7. Mildly enlarged left inguinal lymph nodes up to 1 cm; nonspecific. Electronically signed by: Greig Pique MD 02/28/2024 12:29 AM EDT RP Workstation: [id number]        Disposition: Status is: Inpatient Remains inpatient appropriate because: GI bleeding, ETOH abuse/impending withdrawal   Planned Discharge Destination: Home    Time spent: 35 minutes  Author: Daved JAYSON Pump, DO 02/28/2024 8:29 AM  For on call review [email or web address].

## 2024-02-29 ENCOUNTER — Encounter (HOSPITAL_COMMUNITY): Admission: EM | Disposition: A | Discharge status: Home / Self Care | Payer: Self-pay | Source: Home / Self Care

## 2024-02-29 ENCOUNTER — Observation Stay (HOSPITAL_COMMUNITY): Admitting: Anesthesiology

## 2024-02-29 ENCOUNTER — Encounter (HOSPITAL_COMMUNITY): Payer: Self-pay | Admitting: Internal Medicine

## 2024-02-29 DIAGNOSIS — I10 Essential (primary) hypertension: Secondary | ICD-10-CM | POA: Diagnosis not present

## 2024-02-29 DIAGNOSIS — F17210 Nicotine dependence, cigarettes, uncomplicated: Secondary | ICD-10-CM | POA: Diagnosis not present

## 2024-02-29 DIAGNOSIS — K2990 Gastroduodenitis, unspecified, without bleeding: Secondary | ICD-10-CM

## 2024-02-29 DIAGNOSIS — K920 Hematemesis: Secondary | ICD-10-CM | POA: Diagnosis not present

## 2024-02-29 DIAGNOSIS — F1090 Alcohol use, unspecified, uncomplicated: Secondary | ICD-10-CM | POA: Diagnosis not present

## 2024-02-29 HISTORY — PX: ESOPHAGOGASTRODUODENOSCOPY: SHX5428

## 2024-02-29 LAB — CBC
HCT: 45 % (ref 39.0–52.0)
Hemoglobin: 14.8 g/dL (ref 13.0–17.0)
MCH: 35.2 pg — ABNORMAL HIGH (ref 26.0–34.0)
MCHC: 32.9 g/dL (ref 30.0–36.0)
MCV: 106.9 fL — ABNORMAL HIGH (ref 80.0–100.0)
Platelets: 162 K/uL (ref 150–400)
RBC: 4.21 MIL/uL — ABNORMAL LOW (ref 4.22–5.81)
RDW: 17 % — ABNORMAL HIGH (ref 11.5–15.5)
WBC: 7.2 K/uL (ref 4.0–10.5)
nRBC: 0 % (ref 0.0–0.2)

## 2024-02-29 LAB — BASIC METABOLIC PANEL WITH GFR
Anion gap: 8 (ref 5–15)
BUN: 11 mg/dL (ref 6–20)
CO2: 26 mmol/L (ref 22–32)
Calcium: 9.1 mg/dL (ref 8.9–10.3)
Chloride: 103 mmol/L (ref 98–111)
Creatinine, Ser: 0.93 mg/dL (ref 0.61–1.24)
GFR, Estimated: >60 mL/min (ref >60)
Glucose, Bld: 99 mg/dL (ref 70–99)
Potassium: 3.9 mmol/L (ref 3.5–5.1)
Sodium: 137 mmol/L (ref 135–145)

## 2024-02-29 SURGERY — EGD (ESOPHAGOGASTRODUODENOSCOPY)
Anesthesia: Monitor Anesthesia Care

## 2024-02-29 MED ORDER — HYDRALAZINE HCL 20 MG/ML IJ SOLN
10.0000 mg | Freq: Once | INTRAMUSCULAR | Status: AC
  Administered 2024-02-29: 10 mg via INTRAVENOUS

## 2024-02-29 MED ORDER — PANTOPRAZOLE SODIUM 40 MG PO TBEC: 40.0000 mg | DELAYED_RELEASE_TABLET | Freq: Every day | ORAL | Status: DC

## 2024-02-29 MED ORDER — FOLIC ACID 1 MG PO TABS
1.0000 mg | ORAL_TABLET | Freq: Every day | ORAL | 2 refills | Status: AC
Start: 2024-03-01 — End: ?

## 2024-02-29 MED ORDER — PANTOPRAZOLE SODIUM 40 MG PO TBEC: 40.0000 mg | DELAYED_RELEASE_TABLET | Freq: Every day | ORAL | 2 refills | Status: DC

## 2024-02-29 MED ORDER — THIAMINE HCL 100 MG PO TABS: 100.0000 mg | ORAL_TABLET | Freq: Every day | ORAL | 2 refills | Status: DC

## 2024-02-29 MED ORDER — THIAMINE HCL 100 MG PO TABS: 100.0000 mg | ORAL_TABLET | Freq: Every day | ORAL | 2 refills | Status: AC

## 2024-02-29 MED ORDER — FOLIC ACID 1 MG PO TABS: 1.0000 mg | ORAL_TABLET | Freq: Every day | ORAL | 2 refills | Status: DC

## 2024-02-29 MED ORDER — PROPOFOL 500 MG/50ML IV EMUL
INTRAVENOUS | Status: DC | PRN
Start: 2024-02-29 — End: 2024-02-29
  Administered 2024-02-29: 100 mg via INTRAVENOUS
  Administered 2024-02-29: 50 mg via INTRAVENOUS
  Administered 2024-02-29 (×3): 100 mg via INTRAVENOUS

## 2024-02-29 MED ORDER — LIDOCAINE 2% (20 MG/ML) 5 ML SYRINGE
INTRAMUSCULAR | Status: DC | PRN
  Administered 2024-02-29: 100 mg via INTRAVENOUS

## 2024-02-29 MED ORDER — ADULT MULTIVITAMIN W/MINERALS CH: 1.0000 | ORAL_TABLET | Freq: Every day | ORAL | Status: AC

## 2024-02-29 MED ORDER — SODIUM CHLORIDE 0.9 % IV SOLN: INTRAVENOUS | Status: DC | PRN

## 2024-02-29 MED ORDER — PANTOPRAZOLE SODIUM 40 MG PO TBEC
40.0000 mg | DELAYED_RELEASE_TABLET | Freq: Every day | ORAL | 2 refills | Status: AC
Start: 2024-03-01 — End: ?

## 2024-02-29 MED ORDER — HYDRALAZINE HCL 20 MG/ML IJ SOLN
INTRAMUSCULAR | Status: AC
  Filled 2024-02-29: qty 1
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
## 2024-02-29 NOTE — Progress Notes (Signed)
 Assumed care for pt at 0245

## 2024-02-29 NOTE — Op Note (Signed)
 [facility] Patient Name: James Richard Procedure Date: 02/29/2024 MRN: [id number] Attending MD: Oliva Boots , MD, [id number] Date of Birth: Jul 31, 1990 CSN: [id number] Age: 33 Admit Type: Inpatient Procedure:                Upper GI endoscopy Indications:              Coffee-ground emesis Providers:                Oliva Boots, MD, Ozell Pouch, Curtistine Bishop,                            Technician Referring MD:              Medicines:                Monitored Anesthesia Care Complications:            No immediate complications. Estimated Blood Loss:     Estimated blood loss: none. Estimated blood loss:                            none. Procedure:                Pre-Anesthesia Assessment:                           - Prior to the procedure, a History and Physical                            was performed, and patient medications and                            allergies were reviewed. The patient's tolerance of                            previous anesthesia was also reviewed. The risks                            and benefits of the procedure and the sedation                            options and risks were discussed with the patient.                            All questions were answered, and informed consent                            was obtained. Prior Anticoagulants: The patient has                            taken no anticoagulant or antiplatelet agents. ASA                            Grade Assessment: II - A patient with mild systemic                            disease. After reviewing  the risks and benefits,                            the patient was deemed in satisfactory condition to                            undergo the procedure.                           After obtaining informed consent, the endoscope was                            passed under direct vision. Throughout the                            procedure, the patient's blood pressure, pulse, and                             oxygen saturations were monitored continuously. The                            [id number]-[id number] ([id number]) Olympus endoscope was introduced                            through the mouth, and advanced to the third part                            of duodenum. The upper GI endoscopy was                            accomplished without difficulty. The patient                            tolerated the procedure well. Scope In: Scope Out: Findings:      The larynx was normal.      A small hiatal hernia was present.      LA Grade A (one or more mucosal breaks less than 5 mm, not extending       between tops of 2 mucosal folds) esophagitis with no bleeding was found.      Localized mild inflammation characterized by erythema was found in the       prepyloric region of the stomach.      The second portion of the duodenum and third portion of the duodenum       were normal.      The cardia and gastric fundus were normal on retroflexion.      Localized mildly erythematous mucosa without active bleeding and with no       stigmata of bleeding was found in the duodenal bulb. Impression:               - Normal larynx.                           - Small hiatal hernia.                           -  LA Grade A reflux esophagitis with no bleeding.                           - Gastritis.                           - first portion of the duodenum, second portion of                            the duodenum and third portion of the duodenum.                           - No specimens collected. Mild bulb-itis Moderate Sedation:      Not Applicable - Patient had care per Anesthesia. Recommendation:           - Soft diet today.                           - Continue present medications.                           - Return to GI clinic PRN.                           - Telephone GI clinic if symptomatic PRN. Procedure Code(s):        --- Professional ---                           [phone],  Esophagogastroduodenoscopy, flexible,                            transoral; diagnostic, including collection of                            specimen(s) by brushing or washing, when performed                            (separate procedure) Diagnosis Code(s):        --- Professional ---                           K44.9, Diaphragmatic hernia without obstruction or                            gangrene                           K21.00, Gastro-esophageal reflux disease with                            esophagitis, without bleeding                           K29.70, Gastritis, unspecified, without bleeding                           K92.0, Hematemesis CPT copyright 2022 [employer]. All rights  reserved. The codes documented in this report are preliminary and upon coder review may  be revised to meet current compliance requirements. Oliva Boots, MD 02/29/2024 2:03:02 PM This report has been signed electronically. Number of Addenda: 0

## 2024-02-29 NOTE — Progress Notes (Signed)
 Adine Che 1:36 PM  Subjective: Patient doing well without any signs of further bleeding and no signs of withdrawal and no new complaints  Objective: Vital signs stable afebrile no acute distress exam please see preassessment evaluation chemistries okay CBC normal except increased MCV  Assessment: Seemingly self-limited GI blood loss  Plan: Okay to proceed with endoscopy with anesthesia assistance  [facility]  office [phone] After 5PM or if no answer call [phone]

## 2024-02-29 NOTE — Anesthesia Procedure Notes (Signed)
 Procedure Name: MAC Date/Time: 02/29/2024 1:45 PM  Performed by: Laverda Burnard LABOR, CRNAPre-anesthesia Checklist: Patient identified, Emergency Drugs available, Suction available and Patient being monitored Patient Re-evaluated:Patient Re-evaluated prior to induction Oxygen Delivery Method: Nasal cannula Preoxygenation: Pre-oxygenation with 100% oxygen Induction Type: IV induction Airway Equipment and Method: Bite block Placement Confirmation: positive ETCO2 and breath sounds checked- equal and bilateral Dental Injury: Teeth and Oropharynx as per pre-operative assessment

## 2024-02-29 NOTE — Transfer of Care (Signed)
 Immediate Anesthesia Transfer of Care Note  Patient: James Richard  Procedure(s) Performed: EGD (ESOPHAGOGASTRODUODENOSCOPY)  Patient Location: Endoscopy Unit  Anesthesia Type:MAC  Level of Consciousness: drowsy  Airway & Oxygen Therapy: Patient Spontanous Breathing and Patient connected to nasal cannula oxygen  Post-op Assessment: Report given to RN and Post -op Vital signs reviewed and stable  Post vital signs: Reviewed and stable  Last Vitals:  Vitals Value Taken Time  BP 129/66 02/29/24 14:00  Temp 36.4 C 02/29/24 13:58  Pulse 95 02/29/24 14:00  Resp 13 02/29/24 14:00  SpO2 97 % 02/29/24 14:00  Vitals shown include unfiled device data.  Last Pain:  Vitals:   02/29/24 1358  TempSrc: Temporal  PainSc:          Complications: No notable events documented.

## 2024-02-29 NOTE — Discharge Summary (Signed)
 Physician Discharge Summary   Patient: James Richard MRN: [id number] DOB: 1991-02-07  Admit date:     02/27/2024  Discharge date: 02/29/24  Discharge Physician: Burnard DELENA Cunning   PCP: Allen Lauraine CROME, PA-C   Recommendations at discharge:    Follow up with GI as needed for follow up on esophagitis and gastritis Follow up with Primary Care in 1-2 weeks Recommend consideration of medical therapy for alcohol cessation  Repeat CBC, CMP at follow up Follow up on foot wound healing  Discharge Diagnoses: Principal Problem:   Hematemesis Active Problems:   Major depressive disorder   Alcohol use disorder   Essential hypertension   Neurogenic bladder   Paraplegia (HCC)   Continuous dependence on cigarette smoking  Resolved Problems:   * No resolved hospital problems. *  Hospital Course:  33 y/o M with hx ETOH use disorder presented to the ED for evaluation of 2 episodes of hematemesis at home.  Hgb stable.    Admitted with GI consulted for further evaluation and management as outlined in detail below.  10/29 -- pt doing well this AM.  Had EGD this afternoon.  Tolerating diet well post-procedure.  No acute withdrawal symptoms.  Pt is medically stable and requesting discharge home.   Assessment and Plan:  Hematemesis - resolved Suspect he had bleeding of esophagitis/gastritis vs mallory weiss tear with N/V -hemodynamically stable. Hgb stable. No further episodes of emesis. No hx GI bleeding, varices, or prior endoscopies. CT abdomen GI bleed study no evidence of active bleeding. - Managed with IV Protonix  40 mg BID.  -Pt tolerating soft diet post-EGD today --GI consulted -- EGD today showed esophagitis and gastritis, no bleeding source seen    History of chronic alcohol use Alcohol use disorder - No evidence of withdrawal thus far.  - Continue CIWA protocol, Ativan  as needed, multivitamin, folic acid and thiamine . - He is interested in cessation, seeing therapy currently.   - PCP follow up -- consider naltrexone or other medication for support of alcohol cessation   Major depressive disorder History of PTSD -Currently patient is not taking Lexapro anymore. He is seeing a therapist. No SI/HI   Essential hypertension Resume home ARB  Bilateral lower extremity  paraplegia Neurogenic bladder Wheelchair-bound --Continue In-N-Out catheter as needed.   Continuous dependence on smoking -Nicotine  patch during admission Counseling provided for smoking cessation --PCP follow up   Hyperglycemia - resolved.  AM labs glucose 99 today.  A1c  normal 4.5%  Right foot wound, Left ankle wound, left leg wound -- POA.  Not concerned for infection at this time, monitor closely. Seen by wound care RN, appreciate recommendations: Wound type: 1  Full thickness R foot unknown etiology ? Trauma vs other red moist  2.  L ankle full thickness atypical appearing hyperkeratotic tissue with a dark center ? Etiology  3.  L leg healing full thickness likely trauma pink dry with some dry hemorrhagic tissue superior aspect  Dressing procedure/placement/frequency:  Cleanse R foot wound with Vashe, apply silver hydrofiber Soila [phone]) cut to fit wound bed every other day and secure with silicone foam.  Cleanse L ankle and L leg wounds with Vashe, apply Xeroform gauze (Lawson [phone]) to wound beds every other day  and secure with silicone foam or Kerlix roll gauze whichever is preferred.       Consultants: GI, wound care  Procedures performed: EGD   Disposition: Home  Diet recommendation:  Soft diet  DISCHARGE MEDICATION: Allergies as of 02/29/2024  No Known Allergies      Medication List     STOP taking these medications    Ankle Brace Adjust-to-Fit Misc   hydrochlorothiazide  12.5 MG tablet Commonly known as: HYDRODIURIL    ibuprofen  600 MG tablet Commonly known as: ADVIL        TAKE these medications    folic acid 1 MG tablet Commonly known as:  FOLVITE Take 1 tablet (1 mg total) by mouth daily. Start taking on: March 01, 2024   multivitamin with minerals Tabs tablet Take 1 tablet by mouth daily. Start taking on: March 01, 2024   olmesartan 20 MG tablet Commonly known as: BENICAR Take 20 mg by mouth daily.   oxybutynin 10 MG 24 hr tablet Commonly known as: DITROPAN-XL Take 10 mg by mouth daily.   pantoprazole  40 MG tablet Commonly known as: PROTONIX  Take 1 tablet (40 mg total) by mouth daily. Start taking on: March 01, 2024   thiamine  100 MG tablet Commonly known as: VITAMIN B1 Take 1 tablet (100 mg total) by mouth daily.               Discharge Care Instructions  (From admission, onward)           Start     Ordered   02/29/24 0000  Discharge wound care:       Comments: As above   02/29/24 1553            Discharge Exam: There were no vitals filed for this visit. General exam: awake, alert, no acute distress HEENT: atraumatic, clear conjunctiva, anicteric sclera, moist mucus membranes, hearing grossly normal  Respiratory system: CTAB, no wheezes, rales or rhonchi, normal respiratory effort. Cardiovascular system: normal S1/S2, RRR, no JVD, murmurs, rubs, gallops, no pedal edema.   Gastrointestinal system: soft, NT, ND, no HSM felt, +bowel sounds. Central nervous system: A&O x3. no gross focal neurologic deficits, normal speech Extremities: wounds of R foot, L ankle with clean dry intact dressings Skin: dry, intact, normal temperature Psychiatry: normal mood, congruent affect, judgement and insight appear normal   Condition at discharge: stable  The results of significant diagnostics from this hospitalization (including imaging, microbiology, ancillary and laboratory) are listed below for reference.   Imaging Studies: CT ANGIO GI BLEED Result Date: 02/28/2024 EXAM: CTA ABDOMEN AND PELVIS WITH CONTRAST 02/28/2024 12:04:34 AM TECHNIQUE: CTA images of the abdomen and pelvis with  intravenous contrast. Three-dimensional MIP/volume rendered formations were performed. Automated exposure control, iterative reconstruction, and/or weight based adjustment of the mA/kV was utilized to reduce the radiation dose to as low as reasonably achievable. COMPARISON: CT abdomen and pelvis 02/23/2015. CLINICAL HISTORY: Hematemesis. Patient reports that he is a heavy drinker and usually has emesis first thing in the morning. Patient states that today he has continued to have emesis throughout the day and several have been bloody. Patient endorses RUQ abd pain intermittently. FINDINGS: VASCULATURE: No acute finding. No occlusion or significant stenosis. GI BLEED: No active extravasation of contrast within the GI tract. AORTA: No acute finding. No abdominal aortic aneurysm. No dissection. CELIAC TRUNK: No acute finding. No occlusion or significant stenosis. SUPERIOR MESENTERIC ARTERY: No acute finding. No occlusion or significant stenosis. INFERIOR MESENTERIC ARTERY: No acute finding. No occlusion or significant stenosis. RENAL ARTERIES: Duplicated right renal artery present. No occlusion or significant stenosis. ILIAC ARTERIES: No acute finding. No occlusion or significant stenosis. ABDOMEN/PELVIS: No acute finding. LOWER CHEST: Visualized portion of the lower chest demonstrates no acute abnormality. LIVER: The liver is unremarkable.  GALLBLADDER AND BILE DUCTS: Gallbladder is unremarkable. No biliary ductal dilatation. SPLEEN: The spleen is unremarkable. PANCREAS: The pancreas is unremarkable. ADRENAL GLANDS: Bilateral adrenal glands demonstrate no acute abnormality. KIDNEYS, URETERS AND BLADDER: There is left renal atrophy and scarring. Left renal cysts are present measuring up to 3 cm. There is diffuse bladder wall thickening. No stones in the kidneys or ureters. No hydronephrosis. No perinephric or periureteral stranding. GI AND BOWEL: Stomach and duodenal sweep demonstrate no acute abnormality. There is  sigmoid colon diverticulosis. The appendix is surgically absent. There is no bowel obstruction. No abnormal bowel wall thickening or distension. REPRODUCTIVE: Reproductive organs are unremarkable. PERITONEUM AND RETROPERITONEUM: No ascites or free air. LYMPH NODES: Mildly enlarged left inguinal lymph nodes measuring up to 1 cm. BONES AND SOFT TISSUES: No acute abnormality of the bones. No acute soft tissue abnormality. IMPRESSION: 1. No active gastrointestinal bleeding. 2. No occlusion or hemodynamically significant stenosis of the abdominal or pelvic arterial system. No abdominal aortic aneurysm. No aortic dissection. 3. Duplicated right renal artery. 4. Left renal atrophy and scarring with left renal cysts up to 3 cm. 5. Diffuse bladder wall thickening; correlate clinically for cystitis or outlet obstruction. 6. Sigmoid diverticulosis without evidence of diverticulitis. 7. Mildly enlarged left inguinal lymph nodes up to 1 cm; nonspecific. Electronically signed by: Greig Pique MD 02/28/2024 12:29 AM EDT RP Workstation: [id number]    Microbiology: Results for orders placed or performed during the hospital encounter of 02/23/15  Urine culture     Status: None   Collection Time: 02/23/15  3:12 PM   Specimen: Urine, Random  Result Value Ref Range Status   Specimen Description URINE, RANDOM  Final   Special Requests NONE  Final   Culture MULTIPLE SPECIES PRESENT, SUGGEST RECOLLECTION  Final   Report Status 02/26/2015 FINAL  Final    Labs: CBC: Recent Labs  Lab 02/27/24 2048 02/28/24 0433 02/28/24 1430 02/29/24 0544  WBC 10.1 7.7 7.4 7.2  HGB 18.2* 16.0 16.0 14.8  HCT 55.1* 49.0 50.9 45.0  MCV 104.6* 104.9* 107.6* 106.9*  PLT 204 178 179 162   Basic Metabolic Panel: Recent Labs  Lab 02/27/24 2048 02/28/24 0432 02/29/24 0544  NA 140 137 137  K 3.8 4.2 3.9  CL 99 102 103  CO2 31 27 26   GLUCOSE 85 176* 99  BUN 14 13 11   CREATININE 1.05 1.06 0.93  CALCIUM 10.1 9.2 9.1   Liver  Function Tests: Recent Labs  Lab 02/27/24 2048 02/28/24 0432  AST 21 17  ALT 29 21  ALKPHOS 116 91  BILITOT 1.0 1.2  PROT 7.5 5.9*  ALBUMIN 4.5 3.7   CBG: No results for input(s): GLUCAP in the last 168 hours.  Discharge time spent: less than 30 minutes.  Signed: Burnard DELENA Cunning, DO [facility] 02/29/2024

## 2024-02-29 NOTE — TOC Transition Note (Signed)
 Transition of Care [facility]) - Discharge Note  Patient Details  Name: Keelin Sheridan MRN: [id number] Date of Birth: 03/20/1991  Transition of Care [facility]) CM/SW Contact:  Duwaine GORMAN Aran, LCSW Phone Number: 02/29/2024, 9:41 AM  Clinical Narrative: Care management consulted for ETOH use resources. Patient politely declined resources at this time as he reported he already sees a therapist. Care management signing off.  Final next level of care: Home/Self Care Barriers to Discharge: Barriers Resolved  Patient Goals and CMS Choice Patient states their goals for this hospitalization and ongoing recovery are:: Home Choice offered to / list presented to : NA  Discharge Plan and Services Additional resources added to the After Visit Summary for           DME Arranged: N/A DME Agency: NA  Social Drivers of Health (SDOH) Interventions SDOH Screenings   Food Insecurity: No Food Insecurity (02/28/2024)  Housing: High Risk (02/28/2024)  Transportation Needs: No Transportation Needs (02/28/2024)  Utilities: Not At Risk (02/28/2024)  Financial Resource Strain: Low Risk  (12/05/2023)   Received from [facility]  Physical Activity: Insufficiently Active (12/05/2023)   Received [facility]  Social Connections: Moderately Integrated (12/05/2023)   Received from [facility]  Stress: No Stress Concern Present (12/05/2023)   Received from [facility]  Tobacco Use: High Risk (02/28/2024)   Readmission Risk Interventions     No data to display

## 2024-03-02 NOTE — Anesthesia Postprocedure Evaluation (Signed)
 Anesthesia Post Note  Patient: James Richard  Procedure(s) Performed: EGD (ESOPHAGOGASTRODUODENOSCOPY)     Patient location during evaluation: PACU Anesthesia Type: MAC Level of consciousness: awake and alert Pain management: pain level controlled Vital Signs Assessment: post-procedure vital signs reviewed and stable Respiratory status: spontaneous breathing, nonlabored ventilation, respiratory function stable and patient connected to nasal cannula oxygen Cardiovascular status: stable and blood pressure returned to baseline Postop Assessment: no apparent nausea or vomiting Anesthetic complications: no   No notable events documented.  Last Vitals:  Vitals:   02/29/24 1410 02/29/24 1426  BP: (!) 141/75 (!) 136/94  Pulse: 68 73  Resp: 17 20  Temp:  36.8 C  SpO2: 97% 98%    Last Pain:  Vitals:   02/29/24 1426  TempSrc: Oral  PainSc:                  [name] E

## 2024-03-05 ENCOUNTER — Encounter: Payer: Self-pay | Admitting: Radiology
